# Patient Record
Sex: Female | Born: 1950 | Race: Black or African American | Hispanic: No | Smoking: Never smoker
Health system: Southern US, Community
[De-identification: ages and names within clinical notes are randomized; demographics above are authoritative.]

## PROBLEM LIST (undated history)

## (undated) DIAGNOSIS — E119 Type 2 diabetes mellitus without complications: Secondary | ICD-10-CM

## (undated) DIAGNOSIS — M1990 Unspecified osteoarthritis, unspecified site: Secondary | ICD-10-CM

## (undated) HISTORY — PX: WRIST SURGERY: SHX841

## (undated) HISTORY — DX: Type 2 diabetes mellitus without complications: E11.9

## (undated) HISTORY — DX: Unspecified osteoarthritis, unspecified site: M19.90

---

## 2010-02-13 ENCOUNTER — Ambulatory Visit: Payer: Self-pay | Admitting: Obstetrics & Gynecology

## 2010-02-15 ENCOUNTER — Ambulatory Visit (HOSPITAL_COMMUNITY): Admission: RE | Admit: 2010-02-15 | Discharge: 2010-02-15 | Payer: Self-pay | Admitting: Family Medicine

## 2010-02-28 ENCOUNTER — Ambulatory Visit: Payer: Self-pay | Admitting: Obstetrics and Gynecology

## 2011-06-03 HISTORY — PX: TOTAL HIP ARTHROPLASTY: SHX124

## 2012-11-06 DIAGNOSIS — N83209 Unspecified ovarian cyst, unspecified side: Secondary | ICD-10-CM | POA: Insufficient documentation

## 2012-11-06 DIAGNOSIS — E559 Vitamin D deficiency, unspecified: Secondary | ICD-10-CM | POA: Insufficient documentation

## 2012-11-06 DIAGNOSIS — I10 Essential (primary) hypertension: Secondary | ICD-10-CM | POA: Insufficient documentation

## 2012-11-06 DIAGNOSIS — H409 Unspecified glaucoma: Secondary | ICD-10-CM | POA: Insufficient documentation

## 2012-11-06 DIAGNOSIS — D649 Anemia, unspecified: Secondary | ICD-10-CM | POA: Insufficient documentation

## 2012-11-06 DIAGNOSIS — M1610 Unilateral primary osteoarthritis, unspecified hip: Secondary | ICD-10-CM | POA: Insufficient documentation

## 2012-11-06 DIAGNOSIS — IMO0001 Reserved for inherently not codable concepts without codable children: Secondary | ICD-10-CM | POA: Insufficient documentation

## 2015-03-20 DIAGNOSIS — M5412 Radiculopathy, cervical region: Secondary | ICD-10-CM | POA: Insufficient documentation

## 2015-10-12 DIAGNOSIS — Z83511 Family history of glaucoma: Secondary | ICD-10-CM | POA: Insufficient documentation

## 2015-10-12 DIAGNOSIS — H52223 Regular astigmatism, bilateral: Secondary | ICD-10-CM | POA: Insufficient documentation

## 2015-10-12 DIAGNOSIS — H5210 Myopia, unspecified eye: Secondary | ICD-10-CM | POA: Insufficient documentation

## 2015-10-12 DIAGNOSIS — H401190 Primary open-angle glaucoma, unspecified eye, stage unspecified: Secondary | ICD-10-CM | POA: Insufficient documentation

## 2016-07-23 DIAGNOSIS — H04123 Dry eye syndrome of bilateral lacrimal glands: Secondary | ICD-10-CM | POA: Insufficient documentation

## 2016-07-23 DIAGNOSIS — F40240 Claustrophobia: Secondary | ICD-10-CM | POA: Insufficient documentation

## 2016-09-04 DIAGNOSIS — B351 Tinea unguium: Secondary | ICD-10-CM | POA: Insufficient documentation

## 2016-09-04 DIAGNOSIS — M2040 Other hammer toe(s) (acquired), unspecified foot: Secondary | ICD-10-CM | POA: Insufficient documentation

## 2016-09-04 DIAGNOSIS — R202 Paresthesia of skin: Secondary | ICD-10-CM | POA: Insufficient documentation

## 2016-09-04 DIAGNOSIS — M2011 Hallux valgus (acquired), right foot: Secondary | ICD-10-CM | POA: Insufficient documentation

## 2017-08-05 LAB — HM DIABETES EYE EXAM

## 2017-12-10 LAB — HM DIABETES EYE EXAM

## 2018-06-10 LAB — HM DIABETES EYE EXAM

## 2018-06-30 DIAGNOSIS — M19011 Primary osteoarthritis, right shoulder: Secondary | ICD-10-CM | POA: Insufficient documentation

## 2018-06-30 DIAGNOSIS — M19019 Primary osteoarthritis, unspecified shoulder: Secondary | ICD-10-CM | POA: Insufficient documentation

## 2018-11-01 HISTORY — PX: FOOT SURGERY: SHX648

## 2018-12-10 LAB — HM DIABETES EYE EXAM

## 2019-02-11 LAB — HM DIABETES EYE EXAM

## 2019-04-25 ENCOUNTER — Encounter: Payer: Self-pay | Admitting: Nurse Practitioner

## 2019-04-25 ENCOUNTER — Ambulatory Visit (INDEPENDENT_AMBULATORY_CARE_PROVIDER_SITE_OTHER): Payer: BC Managed Care – PPO | Admitting: Nurse Practitioner

## 2019-04-25 ENCOUNTER — Other Ambulatory Visit: Payer: Self-pay

## 2019-04-25 VITALS — BP 142/70 | HR 76 | Temp 97.2°F | Ht 70.0 in | Wt 196.6 lb

## 2019-04-25 DIAGNOSIS — Z86010 Personal history of colonic polyps: Secondary | ICD-10-CM | POA: Insufficient documentation

## 2019-04-25 DIAGNOSIS — E7800 Pure hypercholesterolemia, unspecified: Secondary | ICD-10-CM

## 2019-04-25 DIAGNOSIS — I10 Essential (primary) hypertension: Secondary | ICD-10-CM

## 2019-04-25 DIAGNOSIS — E1142 Type 2 diabetes mellitus with diabetic polyneuropathy: Secondary | ICD-10-CM | POA: Diagnosis not present

## 2019-04-25 DIAGNOSIS — E559 Vitamin D deficiency, unspecified: Secondary | ICD-10-CM | POA: Diagnosis not present

## 2019-04-25 NOTE — Patient Instructions (Addendum)
Hgb A1c of 8.6. need to increase metformin to 500mg  BID. Need to discontinue metformin ER due to recent medication recall. New rx sent. Normal thyroid, renal and liver function. Inquire if she want to add lyrica at bedtime for leg pain and referral to PT? Start vitamin D supplement 1000IU BID OTC

## 2019-04-25 NOTE — Progress Notes (Signed)
Subjective:  Patient ID: Veronica Alvarado, female    DOB: 10/22/1950  Age: 68 y.o. MRN: [id number]  CC: Establish Care (est care--legs and shoulder painful--tingling worse at night--meds consult?)  Ms. Joyc[facility]immerman [facility] Dr. Jovita KussmaulYbannez. She was last seen in office 03/08/2019. She has hx of DM, HTN, Hyperlipidemia, neuropathy, leukopenia, chronic LE edema and vitamin D deficiency. Today she want to address right arm pain and bilateral leg pain.  Arm Pain  The incident occurred more than 1 week ago. There was no injury mechanism. The pain is present in the right forearm. The quality of the pain is described as aching and shooting. Radiates to: right wrist. The pain has been intermittent since the incident. Associated symptoms include tingling. Pertinent negatives include no chest pain, muscle weakness or numbness. The symptoms are aggravated by movement. She has tried nothing for the symptoms.  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 Bilateral leg pain is chronic and intermittent, she describes as tightness and burning sensation, localized over hamstrings, worsen at night. She has hx of s/p bilateral hip arthroplasty. She denies any falls in last 1year. She completed PT post surgery and this was helpful. Current use of gabapentin 300mg  TID. hx of statin intolerance (elevated CK per previous pcp)-simvastatin discontinued. Last vit D pf 19: no supplement at this time  Medicare Annual Wellness Visit 01/29/2019 01/28/2018, 12/18/2016 (Done)  MAMMOGRAM 02/16/2019 02/15/2018, 11/17/2016  INFLUENZA VACCINE 08/31/2019 (Originally 01/01/2019) 01/28/2018, 02/20/2017  FOOT EXAM 06/29/2019 06/28/2018 (N/S), 01/28/2018 (Done)  OPHTHALMOLOGY EXAM 12/10/2019 12/10/2018, 12/10/2018 (Done)  COLONOSCOPY 07/17/2021 07/17/2016, 07/07/2016 (Done)   Immunization History  Administered Date(s) Administered   Flu Vaccine 30mo and up (FLUZONE/FLULAVAL/FLUARIX VIAL OR SYRINGE) INACTIVATED QUADRAVALENT 02/20/2017,  01/28/2018   Tdap (ADACEL, BOOSTRIX) 10/22/2010   influenza (Historical) 03/23/2009, 03/26/2010   pneumococcal conjugate 13-valent (PREVNAR) 03/10/2016   pneumococcal polysaccharide 23-valent (PNEUMOVAX 23) 10/10/2004, 10/19/2014, 05/08/2017   tuberculin PPD Test (APLISOL, TUBERSOL) 03/10/2016   Reviewed past Medical, Social and Family history today.  Outpatient Medications Prior to Visit  Medication Sig Dispense Refill   aspirin EC 81 MG tablet Take by mouth.     dorzolamide (TRUSOPT) 2 % ophthalmic solution INSTILL 1 DROP INTO BOTH EYES TWICE A DAY     furosemide (LASIX) 20 MG tablet TAKE 1 DAILY AS NEEDED FOR SWELLING IN FEET.     gabapentin (NEURONTIN) 300 MG capsule TAKE 1 CAPSULE BY MOUTH THREE TIMES A DAY     HYDROcodone-acetaminophen (NORCO/VICODIN) 5-325 MG tablet Take by mouth.     Multiple Vitamin (MULTI-VITAMIN) tablet Take by mouth.     Netarsudil-Latanoprost 0.02-0.005 % SOLN Place 1 drop into both eyes nightly.     metFORMIN (GLUCOPHAGE-XR) 500 MG 24 hr tablet TAKE 1 TABLET BY MOUTH TWICE A DAY WITH MEALS     No facility-administered medications prior to visit.     ROS See HPI  Objective:  BP (!) 142/70    Pulse 76    Temp (!) 97.2 F (36.2 C) (Tympanic)    Ht 5\' 10"  (1.778 m)    Wt 196 lb 9.6 oz (89.2 kg)    SpO2 98%    BMI 28.21 kg/m   BP Readings from Last 3 Encounters:  04/25/19 (!) 142/70    Wt Readings from Last 3 Encounters:  04/25/19 196 lb 9.6 oz (89.2 kg)    Physical Exam Vitals signs reviewed.  Cardiovascular:     Rate and Rhythm: Normal rate and regular rhythm.  Pulses: Normal pulses.     Heart sounds: Normal heart sounds.  Pulmonary:     Effort: Pulmonary effort is normal.     Breath sounds: Normal breath sounds.  Musculoskeletal:     Right hip: Normal.     Left hip: Normal.     Right upper leg: Normal.     Left upper leg: Normal.     Right lower leg: Edema present.     Left lower leg: Edema present.  Neurological:      Mental Status: She is alert and oriented to person, place, and time.     Coordination: Coordination normal.     Gait: Gait normal.     Deep Tendon Reflexes: Reflexes normal.  Psychiatric:        Mood and Affect: Mood normal.        Behavior: Behavior normal.        Thought Content: Thought content normal.     Lab Results  Component Value Date   WBC 4.1 04/25/2019   HGB 11.7 04/25/2019   HCT 35.6 04/25/2019   PLT 229 04/25/2019   GLUCOSE 104 (H) 04/25/2019   ALT 18 04/25/2019   AST 27 04/25/2019   NA 141 04/25/2019   K 4.3 04/25/2019   CL 104 04/25/2019   CREATININE 0.84 04/25/2019   BUN 14 04/25/2019   CO2 25 04/25/2019   TSH 1.19 04/25/2019   HGBA1C 8.6 (H) 04/25/2019   MICROALBUR 1.1 04/25/2019    Assessment & Plan:   Adalind was seen today for establish care.  Diagnoses and all orders for this visit:  Essential hypertension -     CBC -     TSH  Diabetic polyneuropathy associated with type 2 diabetes mellitus (HCC) -     metFORMIN (GLUCOPHAGE) 500 MG tablet; Take 1 tablet (500 mg total) by mouth 2 (two) times daily with a meal.  Controlled type 2 diabetes mellitus with diabetic polyneuropathy, without long-term current use of insulin (HCC) -     Microalbumin / creatinine urine ratio -     Hemoglobin A1c -     HTN_1 BMP -     metFORMIN (GLUCOPHAGE) 500 MG tablet; Take 1 tablet (500 mg total) by mouth 2 (two) times daily with a meal.  Hypercholesterolemia -     Hepatic function panel  Vitamin D deficiency -     cholecalciferol (VITAMIN D3) 25 MCG (1000 UT) tablet; Take 1 tablet (1,000 Units total) by mouth 2 (two) times daily.   I have discontinued Kabria Hallinan's metFORMIN. I am also having her start on metFORMIN and cholecalciferol. Additionally, I am having her maintain her aspirin EC, dorzolamide, furosemide, gabapentin, HYDROcodone-acetaminophen, Multi-Vitamin, and Netarsudil-Latanoprost.  Meds ordered this encounter  Medications   metFORMIN  (GLUCOPHAGE) 500 MG tablet    Sig: Take 1 tablet (500 mg total) by mouth 2 (two) times daily with a meal.    Dispense:  180 tablet    Refill:  3    Discontinue metformin ER    Order Specific Question:   Supervising Provider    Answer:   Lucille Passy [[id number]]   cholecalciferol (VITAMIN D3) 25 MCG (1000 UT) tablet    Sig: Take 1 tablet (1,000 Units total) by mouth 2 (two) times daily.    Order Specific Question:   Supervising Provider    Answer:   Lucille Passy [[id number]]    Problem List Items Addressed This Visit      Cardiovascular and Mediastinum  Essential hypertension - Primary   Relevant Medications   aspirin EC 81 MG tablet   furosemide (LASIX) 20 MG tablet   Other Relevant Orders   CBC (Completed)   TSH (Completed)     Endocrine   Controlled type 2 diabetes mellitus, without long-term current use of insulin (HCC)   Relevant Medications   aspirin EC 81 MG tablet   metFORMIN (GLUCOPHAGE) 500 MG tablet   Other Relevant Orders   Microalbumin / creatinine urine ratio (Completed)   Hemoglobin A1c (Completed)   HTN_1 BMP (Completed)   Polyneuropathy in diabetes (HCC)   Relevant Medications   aspirin EC 81 MG tablet   gabapentin (NEURONTIN) 300 MG capsule   metFORMIN (GLUCOPHAGE) 500 MG tablet     Other   Hypercholesterolemia   Relevant Medications   aspirin EC 81 MG tablet   furosemide (LASIX) 20 MG tablet   Other Relevant Orders   Hepatic function panel (Completed)    Other Visit Diagnoses    Vitamin D deficiency       Relevant Medications   cholecalciferol (VITAMIN D3) 25 MCG (1000 UT) tablet       Follow-up: Return in about 3 months (around 07/26/2019) for DM and HTN, hyperlipidemia (video or F2F).  Alysia Penna, NP

## 2019-04-26 LAB — HEPATIC FUNCTION PANEL
ALT: 18 U/L (ref 0–35)
AST: 27 U/L (ref 0–37)
Albumin: 3.9 g/dL (ref 3.5–5.2)
Alkaline Phosphatase: 46 U/L (ref 39–117)
Bilirubin, Direct: 0.1 mg/dL (ref 0.0–0.3)
Total Bilirubin: 0.4 mg/dL (ref 0.2–1.2)
Total Protein: 7.5 g/dL (ref 6.0–8.3)

## 2019-04-26 LAB — CBC
HCT: 35.6 % (ref 35.0–45.0)
Hemoglobin: 11.7 g/dL (ref 11.7–15.5)
MCH: 27 pg (ref 27.0–33.0)
MCHC: 32.9 g/dL (ref 32.0–36.0)
MCV: 82.2 fL (ref 80.0–100.0)
MPV: 11.8 fL (ref 7.5–12.5)
Platelets: 229 10*3/uL (ref 140–400)
RBC: 4.33 10*6/uL (ref 3.80–5.10)
RDW: 13.1 % (ref 11.0–15.0)
WBC: 4.1 10*3/uL (ref 3.8–10.8)

## 2019-04-26 LAB — MICROALBUMIN / CREATININE URINE RATIO
Creatinine, Urine: 346 mg/dL — ABNORMAL HIGH (ref 20–275)
Microalb Creat Ratio: 3 mcg/mg creat (ref <30)
Microalb, Ur: 1.1 mg/dL

## 2019-04-26 LAB — BASIC METABOLIC PANEL
BUN: 14 mg/dL (ref 7–25)
CO2: 25 mmol/L (ref 20–32)
Calcium: 9.5 mg/dL (ref 8.6–10.4)
Chloride: 104 mmol/L (ref 98–110)
Creat: 0.84 mg/dL (ref 0.50–0.99)
Glucose, Bld: 104 mg/dL — ABNORMAL HIGH (ref 65–99)
Potassium: 4.3 mmol/L (ref 3.5–5.3)
Sodium: 141 mmol/L (ref 135–146)

## 2019-04-26 LAB — HEMOGLOBIN A1C: Hgb A1c MFr Bld: 8.6 % — ABNORMAL HIGH (ref 4.6–6.5)

## 2019-04-26 LAB — TSH: TSH: 1.19 mIU/L (ref 0.40–4.50)

## 2019-04-27 ENCOUNTER — Encounter: Payer: Self-pay | Admitting: Nurse Practitioner

## 2019-04-27 MED ORDER — VITAMIN D 25 MCG (1000 UNIT) PO TABS: 1000.0000 [IU] | ORAL_TABLET | Freq: Two times a day (BID) | ORAL | Status: DC

## 2019-04-27 MED ORDER — METFORMIN HCL 500 MG PO TABS
500.0000 mg | ORAL_TABLET | Freq: Two times a day (BID) | ORAL | 3 refills | Status: DC
Start: 2019-04-27 — End: 2019-10-05

## 2019-05-02 ENCOUNTER — Telehealth: Payer: Self-pay | Admitting: Nurse Practitioner

## 2019-05-02 DIAGNOSIS — M79605 Pain in left leg: Secondary | ICD-10-CM

## 2019-05-02 DIAGNOSIS — E1142 Type 2 diabetes mellitus with diabetic polyneuropathy: Secondary | ICD-10-CM

## 2019-05-02 DIAGNOSIS — M79604 Pain in right leg: Secondary | ICD-10-CM

## 2019-05-02 DIAGNOSIS — Z780 Asymptomatic menopausal state: Secondary | ICD-10-CM

## 2019-05-02 DIAGNOSIS — R200 Anesthesia of skin: Secondary | ICD-10-CM

## 2019-05-02 MED ORDER — GABAPENTIN 300 MG PO CAPS: ORAL_CAPSULE | ORAL | 5 refills | Status: DC

## 2019-05-02 NOTE — Telephone Encounter (Signed)
-----   Message from Akilah T Johnson, CMA sent at 04/27/2019  4:32 PM EST ----- Patient is aware of results and verbalized understanding. Pt would like lyrica as well for leg pain. She haven't had a bone density test in years. Patient will pick up new metformin rx tomorrow if pharmacy is open and vit D supp. 

## 2019-05-02 NOTE — Telephone Encounter (Signed)
LVM for the pt to call back--need to inform about gabapentin and bone density (order in system--pt will get a call from the admin team to schedule bone density).

## 2019-05-02 NOTE — Telephone Encounter (Signed)
Instead of adding lyrica at bedtime, advise to increase gabapentin dose to 2caps at bedtime, 1 cap in AM and at [facility]. I entered order for bone density scan and ref for PT

## 2019-05-02 NOTE — Telephone Encounter (Signed)
-----   Message from Veronica Alvarado, [place] sent at 04/27/2019  4:32 PM EST ----- Patient is aware of results and verbalized understanding. Pt would like lyrica as well for leg pain. She haven't had a bone density test in years. Patient will pick up new metformin rx tomorrow if pharmacy is open and vit D supp.

## 2019-05-02 NOTE — Telephone Encounter (Signed)
Informed patient of PCP's recommendations below. She verbalized understanding and did not have any further questions or concerns prior to call ending.

## 2019-05-04 NOTE — Telephone Encounter (Signed)
Pt called and stated that she work with the PT before twice and it never help her. Pt does not want to schedule with PT again.   FYI

## 2019-06-16 LAB — HM DIABETES EYE EXAM

## 2019-06-27 ENCOUNTER — Other Ambulatory Visit: Payer: Self-pay | Admitting: Nurse Practitioner

## 2019-07-08 ENCOUNTER — Telehealth: Payer: Self-pay | Admitting: Nurse Practitioner

## 2019-07-08 NOTE — Telephone Encounter (Signed)
Patient is calling requesting that her bone density be scheduled in [place] closer to her home. CB is [phone]

## 2019-07-08 NOTE — Telephone Encounter (Signed)
Admin team please help  Are we able to change the appt date for bone density with the [facility] location that we request. Pt schedule to see GI on 07/27/2019.

## 2019-07-17 ENCOUNTER — Ambulatory Visit: Payer: BC Managed Care – PPO

## 2019-07-18 NOTE — Telephone Encounter (Signed)
Appointment has been changed to [facility] location as patient requested. Patient aware of appointment.

## 2019-07-20 ENCOUNTER — Other Ambulatory Visit: Payer: Self-pay | Admitting: Nurse Practitioner

## 2019-07-25 ENCOUNTER — Ambulatory Visit (HOSPITAL_BASED_OUTPATIENT_CLINIC_OR_DEPARTMENT_OTHER)
Admission: RE | Admit: 2019-07-25 | Discharge: 2019-07-25 | Disposition: A | Discharge status: Home / Self Care | Payer: BC Managed Care – PPO | Source: Ambulatory Visit | Attending: Nurse Practitioner | Admitting: Nurse Practitioner

## 2019-07-25 ENCOUNTER — Other Ambulatory Visit: Payer: Self-pay

## 2019-07-25 DIAGNOSIS — Z780 Asymptomatic menopausal state: Secondary | ICD-10-CM | POA: Diagnosis not present

## 2019-07-26 ENCOUNTER — Ambulatory Visit: Payer: Medicare Other | Admitting: Nurse Practitioner

## 2019-07-27 ENCOUNTER — Other Ambulatory Visit: Payer: Medicare Other

## 2019-08-08 ENCOUNTER — Telehealth: Payer: Self-pay | Admitting: Nurse Practitioner

## 2019-08-08 NOTE — Telephone Encounter (Signed)
error 

## 2019-08-10 ENCOUNTER — Ambulatory Visit: Payer: Medicare Other | Admitting: Nurse Practitioner

## 2019-08-13 ENCOUNTER — Other Ambulatory Visit: Payer: Self-pay | Admitting: Nurse Practitioner

## 2019-09-04 ENCOUNTER — Encounter: Payer: Self-pay | Admitting: Nurse Practitioner

## 2019-09-06 ENCOUNTER — Other Ambulatory Visit: Payer: Self-pay | Admitting: Nurse Practitioner

## 2019-09-14 LAB — HM DIABETES EYE EXAM

## 2019-09-20 LAB — HM DIABETES EYE EXAM

## 2019-10-04 ENCOUNTER — Other Ambulatory Visit: Payer: Self-pay

## 2019-10-05 ENCOUNTER — Ambulatory Visit (INDEPENDENT_AMBULATORY_CARE_PROVIDER_SITE_OTHER): Payer: BC Managed Care – PPO | Admitting: Nurse Practitioner

## 2019-10-05 ENCOUNTER — Encounter: Payer: Self-pay | Admitting: Nurse Practitioner

## 2019-10-05 VITALS — BP 128/68 | HR 75 | Temp 96.4°F | Ht 70.0 in | Wt 193.8 lb

## 2019-10-05 DIAGNOSIS — E1169 Type 2 diabetes mellitus with other specified complication: Secondary | ICD-10-CM | POA: Diagnosis not present

## 2019-10-05 DIAGNOSIS — I10 Essential (primary) hypertension: Secondary | ICD-10-CM | POA: Diagnosis not present

## 2019-10-05 DIAGNOSIS — E7800 Pure hypercholesterolemia, unspecified: Secondary | ICD-10-CM

## 2019-10-05 DIAGNOSIS — Z01818 Encounter for other preprocedural examination: Secondary | ICD-10-CM

## 2019-10-05 DIAGNOSIS — E1142 Type 2 diabetes mellitus with diabetic polyneuropathy: Secondary | ICD-10-CM

## 2019-10-05 LAB — LIPID PANEL
Cholesterol: 145 mg/dL (ref 0–200)
HDL: 55.7 mg/dL (ref >39.00)
LDL Cholesterol: 68 mg/dL (ref 0–99)
NonHDL: 89.03
Total CHOL/HDL Ratio: 3
Triglycerides: 103 mg/dL (ref 0.0–149.0)
VLDL: 20.6 mg/dL (ref 0.0–40.0)

## 2019-10-05 LAB — BASIC METABOLIC PANEL
BUN: 14 mg/dL (ref 6–23)
CO2: 30 mEq/L (ref 19–32)
Calcium: 9.2 mg/dL (ref 8.4–10.5)
Chloride: 102 mEq/L (ref 96–112)
Creatinine, Ser: 0.9 mg/dL (ref 0.40–1.20)
GFR: 75.01 mL/min (ref >60.00)
Glucose, Bld: 164 mg/dL — ABNORMAL HIGH (ref 70–99)
Potassium: 3.5 mEq/L (ref 3.5–5.1)
Sodium: 137 mEq/L (ref 135–145)

## 2019-10-05 LAB — HEMOGLOBIN A1C: Hgb A1c MFr Bld: 8.4 % — ABNORMAL HIGH (ref 4.6–6.5)

## 2019-10-05 MED ORDER — METFORMIN HCL 500 MG PO TABS: 500.0000 mg | ORAL_TABLET | Freq: Two times a day (BID) | ORAL | 3 refills | Status: DC

## 2019-10-05 MED ORDER — GLIPIZIDE ER 2.5 MG PO TB24: 2.5000 mg | ORAL_TABLET | Freq: Every day | ORAL | 3 refills | Status: DC

## 2019-10-05 NOTE — Assessment & Plan Note (Signed)
BP at goal No medication needed at this time Chronic LE edema with use of furosemide prn BP Readings from Last 3 Encounters:  10/05/19 128/68  04/25/19 (!) 142/70

## 2019-10-05 NOTE — Progress Notes (Signed)
Subjective:  Patient ID: Veronica Alvarado, female    DOB: 1950-07-14  Age: 69 y.o. MRN: [id number]  CC: Follow-up (3 mo f/u-DM, HTN, Hyperlipidemia-pt is fasting-pt is blood sugar around 95, doesn't check BP-Eye exam this year-not sure of place off [address]//not sure on colonoscopy or mammogram)  HPI Veronica Alvarado report upcoming Right foot bunion revision surgery scheduled in June 2021, Veronica Alvarado denies any hx of anesthesia complication.  DM: Elevated HgbA1c at 8.6 Does not check glucose at home. Positive neuropathy and charcoat foot. Negative urine nephropathy. Reports Veronica Alvarado had DM eye exam completed. BP at goal LDL at goal, unable to tolerate satin BP Readings from Last 3 Encounters:  10/05/19 128/68  04/25/19 (!) 142/70   Lipid Panel     Component Value Date/Time   CHOL 145 10/05/2019 0925   TRIG 103.0 10/05/2019 0925   HDL 55.70 10/05/2019 0925   CHOLHDL 3 10/05/2019 0925   VLDL 20.6 10/05/2019 0925   LDLCALC 68 10/05/2019 0925   Reviewed past Medical, Social and Family history today.  Outpatient Medications Prior to Visit  Medication Sig Dispense Refill  . aspirin EC 81 MG tablet Take by mouth.    . cholecalciferol (VITAMIN D3) 25 MCG (1000 UT) tablet Take 1 tablet (1,000 Units total) by mouth 2 (two) times daily.    . dorzolamide (TRUSOPT) 2 % ophthalmic solution INSTILL 1 DROP INTO BOTH EYES TWICE A DAY    . furosemide (LASIX) 20 MG tablet Take 1 tablet (20 mg total) by mouth daily as needed. Need office visit for additional refills 30 tablet 0  . gabapentin (NEURONTIN) 300 MG capsule 1cap in AM and Noon, 2caps at bedtime 120 capsule 5  . HYDROcodone-acetaminophen (NORCO/VICODIN) 5-325 MG tablet Take by mouth.    . Multiple Vitamin (MULTI-VITAMIN) tablet Take by mouth.    . Netarsudil-Latanoprost 0.02-0.005 % SOLN Place 1 drop into both eyes nightly.    . metFORMIN (GLUCOPHAGE) 500 MG tablet Take 1 tablet (500 mg total) by mouth 2 (two) times daily with a meal. 180  tablet 3   No facility-administered medications prior to visit.    ROS See HPI  Objective:  BP 128/68   Pulse 75   Temp (!) 96.4 F (35.8 C) (Tympanic)   Ht 5\' 10"  (1.778 m)   Wt 193 lb 12.8 oz (87.9 kg)   SpO2 100%   BMI 27.81 kg/m   BP Readings from Last 3 Encounters:  10/05/19 128/68  04/25/19 (!) 142/70   Wt Readings from Last 3 Encounters:  10/05/19 193 lb 12.8 oz (87.9 kg)  04/25/19 196 lb 9.6 oz (89.2 kg)   ECG: NSR, no change compared to previous ECG with [facility].  Physical Exam Vitals reviewed.  Constitutional:      Appearance: Veronica Alvarado is obese.  Cardiovascular:     Rate and Rhythm: Normal rate and regular rhythm.     Pulses: Normal pulses.  Pulmonary:     Effort: Pulmonary effort is normal.     Breath sounds: Normal breath sounds.  Abdominal:     General: There is no distension.     Palpations: Abdomen is soft.     Tenderness: There is no abdominal tenderness.  Musculoskeletal:     Cervical back: Normal range of motion and neck supple.     Right lower leg: Edema present.     Left lower leg: Edema present.  Skin:    Comments: Completed diabetic foot exam today  Neurological:     Mental Status:  Veronica Alvarado is alert and oriented to person, place, and time.  Psychiatric:        Mood and Affect: Mood normal.        Behavior: Behavior normal.        Thought Content: Thought content normal.    Lab Results  Component Value Date   WBC 4.1 04/25/2019   HGB 11.7 04/25/2019   HCT 35.6 04/25/2019   PLT 229 04/25/2019   GLUCOSE 164 (H) 10/05/2019   CHOL 145 10/05/2019   TRIG 103.0 10/05/2019   HDL 55.70 10/05/2019   LDLCALC 68 10/05/2019   ALT 18 04/25/2019   AST 27 04/25/2019   NA 137 10/05/2019   K 3.5 10/05/2019   CL 102 10/05/2019   CREATININE 0.90 10/05/2019   BUN 14 10/05/2019   CO2 30 10/05/2019   TSH 1.19 04/25/2019   HGBA1C 8.4 (H) 10/05/2019   MICROALBUR 1.1 04/25/2019    Assessment & Plan:  This visit occurred during the SARS-CoV-2 public  health emergency.  Safety protocols were in place, including screening questions prior to the visit, additional usage of staff PPE, and extensive cleaning of exam room while observing appropriate contact time as indicated for disinfecting solutions.   Veronica Alvarado was seen today for follow-up.  Diagnoses and all orders for this visit:  Essential hypertension -     Basic metabolic panel -     EKG 15-VVOH  Type 2 diabetes mellitus with other specified complication, without long-term current use of insulin (HCC) -     Hemoglobin A1c -     Basic metabolic panel -     glipiZIDE (GLUCOTROL XL) 2.5 MG 24 hr tablet; Take 1 tablet (2.5 mg total) by mouth daily with breakfast. With heaviest meal  Hypercholesterolemia -     Lipid panel  Pre-operative clearance -     EKG 12-Lead  Diabetic polyneuropathy associated with type 2 diabetes mellitus (HCC) -     metFORMIN (GLUCOPHAGE) 500 MG tablet; Take 1 tablet (500 mg total) by mouth 2 (two) times daily with a meal.  Controlled type 2 diabetes mellitus with diabetic polyneuropathy, without long-term current use of insulin (HCC) -     metFORMIN (GLUCOPHAGE) 500 MG tablet; Take 1 tablet (500 mg total) by mouth 2 (two) times daily with a meal.   Veronica Alvarado am having Veronica Alvarado start on glipiZIDE. Veronica Alvarado am also having Veronica Alvarado maintain Veronica Alvarado aspirin EC, dorzolamide, HYDROcodone-acetaminophen, Multi-Vitamin, Netarsudil-Latanoprost, cholecalciferol, gabapentin, furosemide, and metFORMIN.  Meds ordered this encounter  Medications  . glipiZIDE (GLUCOTROL XL) 2.5 MG 24 hr tablet    Sig: Take 1 tablet (2.5 mg total) by mouth daily with breakfast. With heaviest meal    Dispense:  90 tablet    Refill:  3    Order Specific Question:   Supervising Provider    Answer:   Ronnald Nian [[id number]]  . metFORMIN (GLUCOPHAGE) 500 MG tablet    Sig: Take 1 tablet (500 mg total) by mouth 2 (two) times daily with a meal.    Dispense:  180 tablet    Refill:  3    Discontinue  metformin ER    Order Specific Question:   Supervising Provider    Answer:   Ronnald Nian [[id number]]    Problem List Items Addressed This Visit      Cardiovascular and Mediastinum   Essential hypertension - Primary   Relevant Orders   Basic metabolic panel (Completed)   EKG 12-Lead (Completed)     Endocrine  Controlled type 2 diabetes mellitus, without long-term current use of insulin (HCC)   Relevant Medications   glipiZIDE (GLUCOTROL XL) 2.5 MG 24 hr tablet   metFORMIN (GLUCOPHAGE) 500 MG tablet   Polyneuropathy in diabetes (HCC)   Relevant Medications   glipiZIDE (GLUCOTROL XL) 2.5 MG 24 hr tablet   metFORMIN (GLUCOPHAGE) 500 MG tablet     Other   Hypercholesterolemia   Relevant Orders   Lipid panel (Completed)    Other Visit Diagnoses    Pre-operative clearance       Relevant Orders   EKG 12-Lead (Completed)      Follow-up: Return in about 6 months (around 04/06/2020) for DM and HTN, hyperlipidemia.  Veronica Penna, NP

## 2019-10-05 NOTE — Assessment & Plan Note (Signed)
Elevated HgbA1c at 8.6 Does not check glucose at home. Positive neuropathy and charcoat foot. Negative urine nephropathy. Reports she had DM eye exam completed. BP at goal LDL at goal, unable to tolerate satin  BP Readings from Last 3 Encounters:  10/05/19 128/68  04/25/19 (!) 142/70   Lipid Panel     Component Value Date/Time   CHOL 145 10/05/2019 0925   TRIG 103.0 10/05/2019 0925   HDL 55.70 10/05/2019 0925   CHOLHDL 3 10/05/2019 0925   VLDL 20.6 10/05/2019 0925   LDLCALC 68 10/05/2019 0925   Maintain metformin Add glipizide 2.5mg  daily Get eye exam report. Advised about proper foot care and daily check Advised to start gulcose check 2-3x/week (fasting) F/up in 3months

## 2019-10-05 NOTE — Patient Instructions (Addendum)
No change in ECG compared to previous done by [facility].  Have latest eye exam report faxed to me.  Go to lab for blood draw.  Check glucose 2-3x/week before breakfast.  Stable lipid panel with LDL at goal. Stable renal function HgbA1c at 8.4. continue metformin at current dose. Add glipizide, new rx sent. Need to start glucose check before breakfast 3x/week. Bring readings to next OV F/up in 66months

## 2019-10-15 ENCOUNTER — Other Ambulatory Visit: Payer: Self-pay | Admitting: Nurse Practitioner

## 2019-12-27 ENCOUNTER — Other Ambulatory Visit: Payer: Self-pay | Admitting: Nurse Practitioner

## 2019-12-27 DIAGNOSIS — R200 Anesthesia of skin: Secondary | ICD-10-CM

## 2019-12-27 DIAGNOSIS — E1142 Type 2 diabetes mellitus with diabetic polyneuropathy: Secondary | ICD-10-CM

## 2020-01-01 ENCOUNTER — Other Ambulatory Visit: Payer: Self-pay | Admitting: Nurse Practitioner

## 2020-01-01 DIAGNOSIS — E1169 Type 2 diabetes mellitus with other specified complication: Secondary | ICD-10-CM

## 2020-01-02 NOTE — Telephone Encounter (Signed)
Last OV 10/05/19 Last fill 10/05/19  #90/3

## 2020-01-13 ENCOUNTER — Other Ambulatory Visit: Payer: Self-pay | Admitting: Nurse Practitioner

## 2020-02-27 ENCOUNTER — Other Ambulatory Visit: Payer: Self-pay | Admitting: Nurse Practitioner

## 2020-03-26 LAB — HM MAMMOGRAPHY

## 2020-04-09 ENCOUNTER — Encounter: Payer: Self-pay | Admitting: Nurse Practitioner

## 2020-04-09 ENCOUNTER — Ambulatory Visit (INDEPENDENT_AMBULATORY_CARE_PROVIDER_SITE_OTHER): Payer: BC Managed Care – PPO | Admitting: Nurse Practitioner

## 2020-04-09 ENCOUNTER — Other Ambulatory Visit: Payer: Self-pay

## 2020-04-09 VITALS — BP 138/82 | HR 80 | Temp 96.6°F | Ht 70.0 in | Wt 196.6 lb

## 2020-04-09 DIAGNOSIS — M170 Bilateral primary osteoarthritis of knee: Secondary | ICD-10-CM

## 2020-04-09 DIAGNOSIS — I10 Essential (primary) hypertension: Secondary | ICD-10-CM

## 2020-04-09 DIAGNOSIS — E1169 Type 2 diabetes mellitus with other specified complication: Secondary | ICD-10-CM

## 2020-04-09 DIAGNOSIS — E7800 Pure hypercholesterolemia, unspecified: Secondary | ICD-10-CM

## 2020-04-09 DIAGNOSIS — E559 Vitamin D deficiency, unspecified: Secondary | ICD-10-CM | POA: Diagnosis not present

## 2020-04-09 LAB — BASIC METABOLIC PANEL WITH GFR
BUN: 17 mg/dL (ref 6–23)
CO2: 28 meq/L (ref 19–32)
Calcium: 9.7 mg/dL (ref 8.4–10.5)
Chloride: 103 meq/L (ref 96–112)
Creatinine, Ser: 1 mg/dL (ref 0.40–1.20)
GFR: 57.28 mL/min — ABNORMAL LOW
Glucose, Bld: 113 mg/dL — ABNORMAL HIGH (ref 70–99)
Potassium: 4.1 meq/L (ref 3.5–5.1)
Sodium: 139 meq/L (ref 135–145)

## 2020-04-09 LAB — HEMOGLOBIN A1C: Hgb A1c MFr Bld: 7.1 % — ABNORMAL HIGH (ref 4.6–6.5)

## 2020-04-09 LAB — MICROALBUMIN / CREATININE URINE RATIO
Creatinine,U: 16.3 mg/dL
Microalb Creat Ratio: 4.3 mg/g (ref 0.0–30.0)
Microalb, Ur: <0.7 mg/dL (ref 0.0–1.9)

## 2020-04-09 MED ORDER — MELOXICAM 7.5 MG PO TABS: 7.5000 mg | ORAL_TABLET | Freq: Every day | ORAL | 1 refills | Status: DC

## 2020-04-09 MED ORDER — BLOOD GLUCOSE MONITOR KIT: PACK | 0 refills | Status: AC

## 2020-04-09 NOTE — Progress Notes (Signed)
Subjective:  Patient ID: Veronica Alvarado, female    DOB: 1951-02-06  Age: 69 y.o. MRN: [id number]  CC: Follow-up (6 month f/u on DM, HTN, and cholesteral. Pt states she has been experiencing pain in her left knee x2 weeks. )  Knee Pain  The incident occurred more than 1 week ago. There was no injury mechanism. The pain is present in the left knee and right knee. The quality of the pain is described as aching. The pain is moderate. The pain has been fluctuating since onset. Pertinent negatives include no inability to bear weight, loss of motion, loss of sensation, muscle weakness, numbness or tingling. The symptoms are aggravated by movement and weight bearing. She has tried ice and rest for the symptoms. The treatment provided moderate relief.    Essential hypertension BP at goal Persistent LE edema, stable with use of furosemide prn BP Readings from Last 3 Encounters:  04/09/20 138/82  10/05/19 128/68  04/25/19 (!) 142/70   Repeat BMP: mild decline Hold furosemide Repeat BMP in 70month DM (diabetes mellitus) (HCC) No adverse side effects with glipizide and metformin Last HgbA1c of 8.4 provided glucometer rx today: check glucose every AM before breakfast  Repeat HgbA1c: improved 8.4 to 7.1 Maintain medication  Hypercholesterolemia Unable to tolerate statin (myalgia and elevated CK) Repeat lipid panel today.  Lipid Panel     Component Value Date/Time   CHOL 145 10/05/2019 0925   TRIG 103.0 10/05/2019 0925   HDL 55.70 10/05/2019 0925   CHOLHDL 3 10/05/2019 0925   VLDL 20.6 10/05/2019 0925   LDLCALC 68 10/05/2019 0925   Wt Readings from Last 3 Encounters:  04/09/20 196 lb 9.6 oz (89.2 kg)  10/05/19 193 lb 12.8 oz (87.9 kg)  04/25/19 196 lb 9.6 oz (89.2 kg)   BP Readings from Last 3 Encounters:  04/09/20 138/82  10/05/19 128/68  04/25/19 (!) 142/70   Reviewed past Medical, Social and Family history today.  Outpatient Medications Prior to Visit  Medication Sig  Dispense Refill  . aspirin EC 81 MG tablet Take by mouth.    . cholecalciferol (VITAMIN D3) 25 MCG (1000 UT) tablet Take 1 tablet (1,000 Units total) by mouth 2 (two) times daily.    . dorzolamide (TRUSOPT) 2 % ophthalmic solution INSTILL 1 DROP INTO BOTH EYES TWICE A DAY    . furosemide (LASIX) 20 MG tablet TAKE 1 TABLET BY MOUTH EVERY DAY AS NEEDED 90 tablet 0  . gabapentin (NEURONTIN) 300 MG capsule TAKE 1 CAPSULE BY MOUTH IN AM AND NOON, AND 2 CAPS AT BEDTIME 120 capsule 5  . glipiZIDE (GLUCOTROL XL) 2.5 MG 24 hr tablet TAKE 1 TABLET (2.5 MG TOTAL) BY MOUTH DAILY WITH BREAKFAST. WITH HEAVIEST MEAL 270 tablet 1  . HYDROcodone-acetaminophen (NORCO/VICODIN) 5-325 MG tablet Take by mouth.    . metFORMIN (GLUCOPHAGE) 500 MG tablet Take 1 tablet (500 mg total) by mouth 2 (two) times daily with a meal. 180 tablet 3  . Multiple Vitamin (MULTI-VITAMIN) tablet Take by mouth.    . Netarsudil-Latanoprost 0.02-0.005 % SOLN Place 1 drop into both eyes nightly.     No facility-administered medications prior to visit.    ROS See HPI  Objective:  BP 138/82 (BP Location: Left Arm, Patient Position: Sitting, Cuff Size: Normal)   Pulse 80   Temp (!) 96.6 F (35.9 C) (Temporal)   Ht 5' 10"  (13.295m)   Wt 196 lb 9.6 oz (89.2 kg)   BMI 28.21 kg/m  Physical Exam Constitutional:      Appearance: She is obese.  Cardiovascular:     Rate and Rhythm: Normal rate and regular rhythm.     Pulses: Normal pulses.     Heart sounds: Normal heart sounds.  Pulmonary:     Effort: Pulmonary effort is normal.     Breath sounds: Normal breath sounds.  Musculoskeletal:        General: Tenderness present.     Right knee: Crepitus present. No effusion or erythema. Tenderness present over the medial joint line and lateral joint line. No patellar tendon tenderness.     Left knee: Crepitus present. No effusion or erythema. Tenderness present over the medial joint line and lateral joint line. No patellar tendon  tenderness.     Right lower leg: Normal.     Left lower leg: Normal.     Right ankle: Swelling present. No tenderness.     Left ankle: Swelling present. No tenderness.  Skin:    Findings: No erythema or rash.  Neurological:     Mental Status: She is alert and oriented to person, place, and time.    Assessment & Plan:  This visit occurred during the SARS-CoV-2 public health emergency.  Safety protocols were in place, including screening questions prior to the visit, additional usage of staff PPE, and extensive cleaning of exam room while observing appropriate contact time as indicated for disinfecting solutions.   Veronica Alvarado was seen today for follow-up.  Diagnoses and all orders for this visit:  Essential hypertension -     Basic metabolic panel -     Basic metabolic panel; Future  Type 2 diabetes mellitus with other specified complication, without long-term current use of insulin (HCC) -     Microalbumin / creatinine urine ratio -     Hemoglobin A1c -     Basic metabolic panel -     blood glucose meter kit and supplies KIT; Dispense based on patient and insurance preference. Check glucose once a day before breakfast. ICD-10: E11.65  Hypercholesterolemia  Vitamin D deficiency -     Vitamin D 1,25 dihydroxy  Primary osteoarthritis of both knees -     meloxicam (MOBIC) 7.5 MG tablet; Take 1 tablet (7.5 mg total) by mouth daily. With food -     Basic metabolic panel; Future   Problem List Items Addressed This Visit      Cardiovascular and Mediastinum   Essential hypertension - Primary    BP at goal Persistent LE edema, stable with use of furosemide prn BP Readings from Last 3 Encounters:  04/09/20 138/82  10/05/19 128/68  04/25/19 (!) 142/70   Repeat BMP: mild decline Hold furosemide Repeat BMP in 91month     Relevant Orders   Basic metabolic panel (Completed)   Basic metabolic panel     Endocrine   DM (diabetes mellitus) ([facility]    No adverse side effects with  glipizide and metformin Last HgbA1c of 8.4 provided glucometer rx today: check glucose every AM before breakfast  Repeat HgbA1c: improved 8.4 to 7.1 Maintain medication      Relevant Medications   blood glucose meter kit and supplies KIT   Other Relevant Orders   Microalbumin / creatinine urine ratio (Completed)   Hemoglobin A1c (Completed)   Basic metabolic panel (Completed)     Other   Hypercholesterolemia    Unable to tolerate statin (myalgia and elevated CK) Repeat lipid panel today.  Lipid Panel     Component Value  Date/Time   CHOL 145 10/05/2019 0925   TRIG 103.0 10/05/2019 0925   HDL 55.70 10/05/2019 0925   CHOLHDL 3 10/05/2019 0925   VLDL 20.6 10/05/2019 0925   LDLCALC 68 10/05/2019 0925        Vitamin D deficiency   Relevant Orders   Vitamin D 1,25 dihydroxy    Other Visit Diagnoses    Primary osteoarthritis of both knees       Relevant Medications   meloxicam (MOBIC) 7.5 MG tablet   Other Relevant Orders   Basic metabolic panel      Follow-up: Return in about 6 months (around 10/07/2020) for DM and HTN, hyperlipidemia ([place]).  Wilfred Lacy, NP

## 2020-04-09 NOTE — Assessment & Plan Note (Addendum)
BP at goal Persistent LE edema, stable with use of furosemide prn BP Readings from Last 3 Encounters:  04/09/20 138/82  10/05/19 128/68  04/25/19 (!) 142/70   Repeat BMP: mild decline Hold furosemide Repeat BMP in 71month

## 2020-04-09 NOTE — Patient Instructions (Addendum)
Go to lab for blood draw and urine collection.  Use meloxicam for knee pain. May also use topical Biofreeze gel or Aspercreme OTC as needed.  Schedule appt for repeat mammogram: [phone]

## 2020-04-09 NOTE — Assessment & Plan Note (Signed)
Unable to tolerate statin (myalgia and elevated CK) Repeat lipid panel today.  Lipid Panel     Component Value Date/Time   CHOL 145 10/05/2019 0925   TRIG 103.0 10/05/2019 0925   HDL 55.70 10/05/2019 0925   CHOLHDL 3 10/05/2019 0925   VLDL 20.6 10/05/2019 0925   LDLCALC 68 10/05/2019 0925

## 2020-04-09 NOTE — Assessment & Plan Note (Addendum)
No adverse side effects with glipizide and metformin Last HgbA1c of 8.4 provided glucometer rx today: check glucose every AM before breakfast  Repeat HgbA1c: improved 8.4 to 7.1 Maintain medication

## 2020-04-13 LAB — VITAMIN D 1,25 DIHYDROXY
Vitamin D 1, 25 (OH)2 Total: 53 pg/mL (ref 18–72)
Vitamin D2 1, 25 (OH)2: <8 pg/mL
Vitamin D3 1, 25 (OH)2: 53 pg/mL

## 2020-05-09 ENCOUNTER — Other Ambulatory Visit: Payer: Self-pay

## 2020-05-10 ENCOUNTER — Ambulatory Visit (INDEPENDENT_AMBULATORY_CARE_PROVIDER_SITE_OTHER): Payer: BC Managed Care – PPO | Admitting: Nurse Practitioner

## 2020-05-10 ENCOUNTER — Encounter: Payer: Self-pay | Admitting: Nurse Practitioner

## 2020-05-10 VITALS — BP 130/70 | HR 88 | Temp 97.6°F | Ht 70.0 in | Wt 196.0 lb

## 2020-05-10 DIAGNOSIS — M25562 Pain in left knee: Secondary | ICD-10-CM | POA: Diagnosis not present

## 2020-05-10 DIAGNOSIS — M25561 Pain in right knee: Secondary | ICD-10-CM

## 2020-05-10 DIAGNOSIS — G8929 Other chronic pain: Secondary | ICD-10-CM

## 2020-05-10 DIAGNOSIS — M170 Bilateral primary osteoarthritis of knee: Secondary | ICD-10-CM

## 2020-05-10 MED ORDER — ACETAMINOPHEN-CODEINE #2 300-15 MG PO TABS: 1.0000 | ORAL_TABLET | Freq: Four times a day (QID) | ORAL | 0 refills | Status: AC | PRN

## 2020-05-10 NOTE — Progress Notes (Signed)
Subjective:  Patient ID: Veronica Alvarado, female    DOB: 1951-04-03  Age: 69 y.o. MRN: [id number]  CC: Acute Visit (Pt is here for knee pain, pt states pain is at 7, has been ongoing x1 month, pain has gotten worse over time, current meds aren't helping, pt states using heat helps, certain movements make pain more intense. Pt denies any injury )  Knee Pain  The incident occurred more than 1 week ago. There was no injury mechanism. The pain is present in the left knee and right knee. The quality of the pain is described as aching. The pain is severe. The pain has been constant since onset. Pertinent negatives include no inability to bear weight, loss of motion, loss of sensation, muscle weakness, numbness or tingling. She reports no foreign bodies present. The symptoms are aggravated by weight bearing and movement. She has tried acetaminophen, NSAIDs, rest, ice and heat for the symptoms. The treatment provided mild relief.   Reviewed past Medical, Social and Family history today.  Outpatient Medications Prior to Visit  Medication Sig Dispense Refill  . aspirin EC 81 MG tablet Take by mouth.    . blood glucose meter kit and supplies KIT Dispense based on patient and insurance preference. Check glucose once a day before breakfast. ICD-10: E11.65 1 each 0  . cholecalciferol (VITAMIN D3) 25 MCG (1000 UT) tablet Take 1 tablet (1,000 Units total) by mouth 2 (two) times daily.    . dorzolamide (TRUSOPT) 2 % ophthalmic solution INSTILL 1 DROP INTO BOTH EYES TWICE A DAY    . furosemide (LASIX) 20 MG tablet TAKE 1 TABLET BY MOUTH EVERY DAY AS NEEDED 90 tablet 0  . gabapentin (NEURONTIN) 300 MG capsule TAKE 1 CAPSULE BY MOUTH IN AM AND NOON, AND 2 CAPS AT BEDTIME 120 capsule 5  . glipiZIDE (GLUCOTROL XL) 2.5 MG 24 hr tablet TAKE 1 TABLET (2.5 MG TOTAL) BY MOUTH DAILY WITH BREAKFAST. WITH HEAVIEST MEAL 270 tablet 1  . metFORMIN (GLUCOPHAGE) 500 MG tablet Take 1 tablet (500 mg total) by mouth 2 (two) times  daily with a meal. 180 tablet 3  . Multiple Vitamin (MULTI-VITAMIN) tablet Take by mouth.    . Netarsudil-Latanoprost 0.02-0.005 % SOLN Place 1 drop into both eyes nightly.    . meloxicam (MOBIC) 7.5 MG tablet Take 1 tablet (7.5 mg total) by mouth daily. With food (Patient not taking: Reported on 05/10/2020) 90 tablet 1  . HYDROcodone-acetaminophen (NORCO/VICODIN) 5-325 MG tablet Take by mouth. (Patient not taking: Reported on 05/10/2020)     No facility-administered medications prior to visit.    ROS See HPI  Objective:  BP 130/70 (BP Location: Left Arm, Patient Position: Sitting, Cuff Size: Large)   Pulse 88   Temp 97.6 F (36.4 C) (Temporal)   Ht 5' 10" (1.778 m)   Wt 196 lb (88.9 kg)   SpO2 99%   BMI 28.12 kg/m   Physical Exam Vitals reviewed.  Musculoskeletal:        General: Swelling and tenderness present. No deformity or signs of injury.     Right knee: Crepitus present. No swelling, deformity, effusion or erythema. Normal range of motion. Tenderness present over the medial joint line and lateral joint line. No patellar tendon tenderness.     Left knee: Crepitus present. No swelling, deformity, effusion or erythema. Normal range of motion. Tenderness present over the medial joint line and lateral joint line. No patellar tendon tenderness.  Neurological:     Mental Status:  She is alert.    Assessment & Plan:  This visit occurred during the SARS-CoV-2 public health emergency.  Safety protocols were in place, including screening questions prior to the visit, additional usage of staff PPE, and extensive cleaning of exam room while observing appropriate contact time as indicated for disinfecting solutions.   Veronica Alvarado was seen today for acute visit.  Diagnoses and all orders for this visit:  Primary osteoarthritis of both knees -     Ambulatory referral to Sports Medicine -     acetaminophen-codeine (TYLENOL #2) 300-15 MG tablet; Take 1 tablet by mouth every 6 (six) hours as  needed for up to 5 days for moderate pain.  Chronic pain of both knees -     Ambulatory referral to Sports Medicine -     acetaminophen-codeine (TYLENOL #2) 300-15 MG tablet; Take 1 tablet by mouth every 6 (six) hours as needed for up to 5 days for moderate pain.  Stop OTC tylenol while take tylenol with codeine. Continue meloxicam once a day with food.  Problem List Items Addressed This Visit   None   Visit Diagnoses    Primary osteoarthritis of both knees    -  Primary   Relevant Medications   acetaminophen-codeine (TYLENOL #2) 300-15 MG tablet   Other Relevant Orders   Ambulatory referral to Sports Medicine   Chronic pain of both knees       Relevant Medications   acetaminophen-codeine (TYLENOL #2) 300-15 MG tablet   Other Relevant Orders   Ambulatory referral to Sports Medicine      Follow-up: Return if symptoms worsen or fail to improve.  Wilfred Lacy, NP

## 2020-05-10 NOTE — Patient Instructions (Signed)
Stop OTC tylenol while take tylenol with codeine. Continue meloxicam once a day with food.  You will be contacted to schedule appt with sports medicine.

## 2020-05-21 ENCOUNTER — Ambulatory Visit (HOSPITAL_BASED_OUTPATIENT_CLINIC_OR_DEPARTMENT_OTHER)
Admission: RE | Admit: 2020-05-21 | Discharge: 2020-05-21 | Disposition: A | Discharge status: Home / Self Care | Payer: BC Managed Care – PPO | Source: Ambulatory Visit | Attending: Family Medicine | Admitting: Family Medicine

## 2020-05-21 ENCOUNTER — Ambulatory Visit: Payer: Self-pay

## 2020-05-21 ENCOUNTER — Ambulatory Visit (INDEPENDENT_AMBULATORY_CARE_PROVIDER_SITE_OTHER): Payer: BC Managed Care – PPO | Admitting: Family Medicine

## 2020-05-21 ENCOUNTER — Other Ambulatory Visit: Payer: Self-pay | Admitting: Family Medicine

## 2020-05-21 ENCOUNTER — Other Ambulatory Visit: Payer: Self-pay

## 2020-05-21 ENCOUNTER — Encounter: Payer: Self-pay | Admitting: Family Medicine

## 2020-05-21 VITALS — BP 140/70 | Ht 70.0 in | Wt 180.0 lb

## 2020-05-21 DIAGNOSIS — M25461 Effusion, right knee: Secondary | ICD-10-CM | POA: Insufficient documentation

## 2020-05-21 DIAGNOSIS — M179 Osteoarthritis of knee, unspecified: Secondary | ICD-10-CM | POA: Insufficient documentation

## 2020-05-21 DIAGNOSIS — M25462 Effusion, left knee: Secondary | ICD-10-CM

## 2020-05-21 DIAGNOSIS — M1710 Unilateral primary osteoarthritis, unspecified knee: Secondary | ICD-10-CM | POA: Insufficient documentation

## 2020-05-21 DIAGNOSIS — M25562 Pain in left knee: Secondary | ICD-10-CM

## 2020-05-21 MED ORDER — TRIAMCINOLONE ACETONIDE 40 MG/ML IJ SUSP
40.0000 mg | Freq: Once | INTRAMUSCULAR | Status: AC
  Administered 2020-05-21: 11:00:00 40 mg via INTRA_ARTICULAR

## 2020-05-21 NOTE — Progress Notes (Signed)
Laramie Gelles - 69 y.o. female MRN [id number]  Date of birth: 05-Oct-1950  SUBJECTIVE:  Including CC & ROS.  No chief complaint on file.   Jayci Baskett is a 69 y.o. female that is presenting with acute on chronic bilateral knee pain.  She has tried over-the-counter medications with limited improvement.  Denies any injury or inciting event.  Seems to be worse when she is bending down.  No history of knee surgery.  She has had bilateral total hip arthroplasties.  Pain localized to the knee.   Review of Systems See HPI   HISTORY: Past Medical, Surgical, Social, and Family History Reviewed & Updated per EMR.   Pertinent Historical Findings include:  Past Medical History:  Diagnosis Date  . Arthritis   . Diabetes mellitus without complication [facility])     Past Surgical History:  Procedure Laterality Date  . FOOT SURGERY Right 11/2018  . TOTAL HIP ARTHROPLASTY Bilateral 2013  . WRIST SURGERY Right     Family History  Problem Relation Age of Onset  . Sudden death Mother   . Cataracts Father   . Diabetes Father   . Cancer Brother        prostate cancer    Social History   Socioeconomic History  . Marital status: Divorced    Spouse name: Not on file  . Number of children: Not on file  . Years of education: Not on file  . Highest education level: Not on file  Occupational History  . Not on file  Tobacco Use  . Smoking status: Never Smoker  . Smokeless tobacco: Never Used  Vaping Use  . Vaping Use: Never used  Substance and Sexual Activity  . Alcohol use: Never  . Drug use: Never  . Sexual activity: Not on file  Other Topics Concern  . Not on file  Social History Narrative  . Not on file   Social Determinants of Health   Financial Resource Strain: Not on file  Food Insecurity: Not on file  Transportation Needs: Not on file  Physical Activity: Not on file  Stress: Not on file  Social Connections: Not on file  Intimate Partner Violence: Not on file      PHYSICAL EXAM:  VS: BP 140/70   Ht 5\' 10"  (1.778 m)   Wt 180 lb (81.6 kg)   BMI 25.83 kg/m  Physical Exam Gen: NAD, alert, cooperative with exam, well-appearing MSK:  Right and left knee: Effusions bilaterally. Normal range of motion. Normal strength resistance. No instability. Neurovascular intact  Limited ultrasound: Right knee, left knee:  Right knee: Mild effusion in suprapatellar pouch. Calcification appreciated within the suprapatellar pouch. Normal-appearing quadricep patellar tendon. Mild degenerative changes of the lateral meniscus. Mild medial joint space narrowing  Left knee: Moderate effusion. Calcification appreciated within the suprapatellar pouch.  Increased hyperemia associated with the synovium.  Hyperechoic changes to suggest possible gouty contribution. Mild degenerative changes of the lateral meniscus. Medial joint space narrowing  Summary: Effusion bilaterally and changes to possibly related to gouty presence   Ultrasound and interpretation by , MD   Aspiration/Injection Procedure Note Padme Arriaga [birth year]  Procedure: Injection Indications: Right knee pain  Procedure Details Consent: Risks of procedure as well as the alternatives and risks of each were explained to the (patient/caregiver).  Consent for procedure obtained. Time Out: Verified patient identification, verified procedure, site/side was marked, verified correct patient position, special equipment/implants available, medications/allergies/relevent history reviewed, required imaging and test results available.  Performed.  The  area was cleaned with iodine and alcohol swabs.    The right knee superior lateral suprapatellar pouch was injected using 1 cc's of 40 mg Kenalog and 4 cc's of 0.25% bupivacaine with a 25 1 1/2" needle.  Ultrasound was used. Images were obtained in long views showing the injection.     A sterile dressing was applied.  Patient did  tolerate procedure well.   Aspiration/Injection Procedure Note Arline Ketter [birth year]  Procedure: Aspiration and Injection Indications: Left knee pain  Procedure Details Consent: Risks of procedure as well as the alternatives and risks of each were explained to the (patient/caregiver).  Consent for procedure obtained. Time Out: Verified patient identification, verified procedure, site/side was marked, verified correct patient position, special equipment/implants available, medications/allergies/relevent history reviewed, required imaging and test results available.  Performed.  The area was cleaned with iodine and alcohol swabs.    The left knee superior lateral suprapatellar approach was injected using 3 cc of 1% lidocaine on a 25-gauge 1-1/2 inch needle.  An 18-gauge 1-1/2 inch needle was used to achieve aspiration.  The syringe was switched and a mixture containing 1 cc's of 40 mg Kenalog and 4 cc's of 0.25% bupivacaine was injected.  Ultrasound was used. Images were obtained in long views showing the injection.    Amount of Fluid Aspirated: 31mL Character of Fluid: straw colored and cloudy Fluid was sent [id number] count and crystal identification  A sterile dressing was applied.  Patient did tolerate procedure well.    ASSESSMENT & PLAN:   Bilateral knee effusions Acute on chronic in nature.  Has bilateral effusions and changes to suggest a possible gouty presence. -Counseled on home exercise therapy and supportive care. -X-rays. -Injection of right knee.  Aspiration injection of left knee. -Synovial fluid analysis.

## 2020-05-21 NOTE — Addendum Note (Signed)
Addended by: Annita Brod on: 05/21/2020 10:34 AM   Modules accepted: Orders

## 2020-05-21 NOTE — Patient Instructions (Signed)
Nice to meet you Please try ice  Please try the exercises  I will call with the results from today  Please send me a message in MyChart with any questions or updates.  Please see me back in 4 weeks.   --Dr. Asiah Browder  

## 2020-05-21 NOTE — Assessment & Plan Note (Signed)
Acute on chronic in nature.  Has bilateral effusions and changes to suggest a possible gouty presence. -Counseled on home exercise therapy and supportive care. -X-rays. -Injection of right knee.  Aspiration injection of left knee. -Synovial fluid analysis.

## 2020-05-22 LAB — SYNOVIAL FLUID ANALYSIS, COMPLETE
Basophils, %: 0 %
Eosinophils-Synovial: 0 % (ref 0–2)
Lymphocytes-Synovial Fld: 44 % (ref 0–74)
Monocyte/Macrophage: 48 % (ref 0–69)
Neutrophil, Synovial: 2 % (ref 0–24)
Synoviocytes, %: 6 % (ref 0–15)
WBC, Synovial: 289 cells/uL — ABNORMAL HIGH (ref <150)

## 2020-05-23 ENCOUNTER — Telehealth: Payer: Self-pay | Admitting: Family Medicine

## 2020-05-23 NOTE — Telephone Encounter (Signed)
Informed of results.   Myra Rude, MD [facility] 05/23/2020, 8:41 AM

## 2020-06-18 ENCOUNTER — Ambulatory Visit: Payer: BC Managed Care – PPO | Admitting: Family Medicine

## 2020-06-19 ENCOUNTER — Ambulatory Visit (INDEPENDENT_AMBULATORY_CARE_PROVIDER_SITE_OTHER): Payer: BC Managed Care – PPO | Admitting: Family Medicine

## 2020-06-19 ENCOUNTER — Other Ambulatory Visit: Payer: Self-pay

## 2020-06-19 DIAGNOSIS — M170 Bilateral primary osteoarthritis of knee: Secondary | ICD-10-CM | POA: Diagnosis not present

## 2020-06-19 NOTE — Progress Notes (Signed)
  Veronica Alvarado - 70 y.o. female MRN [id number]  Date of birth: 07/07/1950  SUBJECTIVE:  Including CC & ROS.  No chief complaint on file.   Veronica Alvarado is a 70 y.o. female that is following up for her bilateral knee pain. She has had improvement with the steroid injection please has some pain going up and down stairs.  Review of Systems See HPI   HISTORY: Past Medical, Surgical, Social, and Family History Reviewed & Updated per EMR.   Pertinent Historical Findings include:  Past Medical History:  Diagnosis Date  . Arthritis   . Diabetes mellitus without complication [facility])     Past Surgical History:  Procedure Laterality Date  . FOOT SURGERY Right 11/2018  . TOTAL HIP ARTHROPLASTY Bilateral 2013  . WRIST SURGERY Right     Family History  Problem Relation Age of Onset  . Sudden death Mother   . Cataracts Father   . Diabetes Father   . Cancer Brother        prostate cancer    Social History   Socioeconomic History  . Marital status: Divorced    Spouse name: Not on file  . Number of children: Not on file  . Years of education: Not on file  . Highest education level: Not on file  Occupational History  . Not on file  Tobacco Use  . Smoking status: Never Smoker  . Smokeless tobacco: Never Used  Vaping Use  . Vaping Use: Never used  Substance and Sexual Activity  . Alcohol use: Never  . Drug use: Never  . Sexual activity: Not on file  Other Topics Concern  . Not on file  Social History Narrative  . Not on file   Social Determinants of Health   Financial Resource Strain: Not on file  Food Insecurity: Not on file  Transportation Needs: Not on file  Physical Activity: Not on file  Stress: Not on file  Social Connections: Not on file  Intimate Partner Violence: Not on file     PHYSICAL EXAM:  VS: Ht 5\' 10"  (1.778 m)   Wt 180 lb (81.6 kg)   BMI 25.83 kg/m  Physical Exam Gen: NAD, alert, cooperative with exam, well-appearing    ASSESSMENT &  PLAN:   OA (osteoarthritis) of knee Likely related to degenerative changes. May have component of patellofemoral syndrome as well. Has had improvement with the steroid injections. -Counseled on home exercise therapy and supportive care. -Samples of Pennsaid. -Pursue gel injections.

## 2020-06-19 NOTE — Patient Instructions (Signed)
Good to see you Please try the rub on medicine  Please use ice as needed Please continue the exercises   Please send me a message in MyChart with any questions or updates.  We will call once the gel injections are in.   --Dr. Jordan Likes

## 2020-06-19 NOTE — Assessment & Plan Note (Signed)
Likely related to degenerative changes. May have component of patellofemoral syndrome as well. Has had improvement with the steroid injections. -Counseled on home exercise therapy and supportive care. -Samples of Pennsaid. -Pursue gel injections.

## 2020-06-22 ENCOUNTER — Other Ambulatory Visit: Payer: Self-pay

## 2020-06-22 DIAGNOSIS — I10 Essential (primary) hypertension: Secondary | ICD-10-CM

## 2020-06-22 MED ORDER — FUROSEMIDE 20 MG PO TABS: 20.0000 mg | ORAL_TABLET | Freq: Every day | ORAL | 0 refills | Status: DC | PRN

## 2020-06-24 ENCOUNTER — Other Ambulatory Visit: Payer: Self-pay | Admitting: Nurse Practitioner

## 2020-06-24 DIAGNOSIS — E1169 Type 2 diabetes mellitus with other specified complication: Secondary | ICD-10-CM

## 2020-07-04 ENCOUNTER — Other Ambulatory Visit: Payer: Self-pay

## 2020-07-04 ENCOUNTER — Ambulatory Visit: Payer: Self-pay

## 2020-07-04 ENCOUNTER — Ambulatory Visit (INDEPENDENT_AMBULATORY_CARE_PROVIDER_SITE_OTHER): Payer: BC Managed Care – PPO | Admitting: Family Medicine

## 2020-07-04 DIAGNOSIS — M170 Bilateral primary osteoarthritis of knee: Secondary | ICD-10-CM

## 2020-07-04 NOTE — Patient Instructions (Signed)
Good to see you Please try ice as needed   Please send me a message in MyChart with any questions or updates.  Please see me back in 4 weeks.   --Dr. Trelyn Vanderlinde  

## 2020-07-04 NOTE — Assessment & Plan Note (Addendum)
Acute on chronic. Has received steroid injection.  - counseled on home exercise therapy and supportive care  - gel injections today  - could consider physical therapy

## 2020-07-04 NOTE — Progress Notes (Signed)
Veronica Alvarado - 70 y.o. female MRN [id number]  Date of birth: [birth year]  SUBJECTIVE:  Including CC & ROS.  No chief complaint on file.   Veronica Alvarado is a 70 y.o. female that is here for bilateral gel injections.    Review of Systems See HPI   HISTORY: Past Medical, Surgical, Social, and Family History Reviewed & Updated per EMR.   Pertinent Historical Findings include:  Past Medical History:  Diagnosis Date  . Arthritis   . Diabetes mellitus without complication [facility])     Past Surgical History:  Procedure Laterality Date  . FOOT SURGERY Right 11/2018  . TOTAL HIP ARTHROPLASTY Bilateral 2013  . WRIST SURGERY Right     Family History  Problem Relation Age of Onset  . Sudden death Mother   . Cataracts Father   . Diabetes Father   . Cancer Brother        prostate cancer    Social History   Socioeconomic History  . Marital status: Divorced    Spouse name: Not on file  . Number of children: Not on file  . Years of education: Not on file  . Highest education level: Not on file  Occupational History  . Not on file  Tobacco Use  . Smoking status: Never Smoker  . Smokeless tobacco: Never Used  Vaping Use  . Vaping Use: Never used  Substance and Sexual Activity  . Alcohol use: Never  . Drug use: Never  . Sexual activity: Not on file  Other Topics Concern  . Not on file  Social History Narrative  . Not on file   Social Determinants of Health   Financial Resource Strain: Not on file  Food Insecurity: Not on file  Transportation Needs: Not on file  Physical Activity: Not on file  Stress: Not on file  Social Connections: Not on file  Intimate Partner Violence: Not on file     PHYSICAL EXAM:  VS: BP 128/86 (BP Location: Left Arm, Patient Position: Sitting, Cuff Size: Large)   Ht 5\' 10"  (1.778 m)   Wt 180 lb (81.6 kg)   BMI 25.83 kg/m  Physical Exam Gen: NAD, alert, cooperative with exam, well-appearing    Aspiration/Injection Procedure  Note Veronica Alvarado 08/10/1950  Procedure: Injection Indications: right knee pain   Procedure Details Consent: Risks of procedure as well as the alternatives and risks of each were explained to the (patient/caregiver).  Consent for procedure obtained. Time Out: Verified patient identification, verified procedure, site/side was marked, verified correct patient position, special equipment/implants available, medications/allergies/relevent history reviewed, required imaging and test results available.  Performed.  The area was cleaned with iodine and alcohol swabs.    The right knee superior lateral suprapatellar pouch was injected using 4 cc's of 1% lidocaine with a 22 1 1/2" needle.  The syringe was switched and a 60 mg/3 mL was injected. Ultrasound was used. Images were obtained in  Long views showing the injection.    A sterile dressing was applied.  Patient did tolerate procedure well.  Aspiration/Injection Procedure Note Veronica Alvarado 1950/10/19  Procedure: Injection Indications: left knee pain   Procedure Details Consent: Risks of procedure as well as the alternatives and risks of each were explained to the (patient/caregiver).  Consent for procedure obtained. Time Out: Verified patient identification, verified procedure, site/side was marked, verified correct patient position, special equipment/implants available, medications/allergies/relevent history reviewed, required imaging and test results available.  Performed.  The area was cleaned with iodine and alcohol swabs.  The left knee superior lateral suprapatellar pouch was injected using 4 cc's of 1% lidocaine with a 22 1 1/2" needle.  The syringe was switched and a 60 mg/3 mL was injected. Ultrasound was used. Images were obtained in  Long views showing the injection.    A sterile dressing was applied.  Patient did tolerate procedure well.  ASSESSMENT & PLAN:   OA (osteoarthritis) of knee Acute on chronic. Has  received steroid injection.  - counseled on home exercise therapy and supportive care  - gel injections today  - could consider physical therapy

## 2020-07-05 ENCOUNTER — Other Ambulatory Visit: Payer: Self-pay | Admitting: Nurse Practitioner

## 2020-07-28 ENCOUNTER — Other Ambulatory Visit: Payer: Self-pay | Admitting: Nurse Practitioner

## 2020-07-30 ENCOUNTER — Other Ambulatory Visit: Payer: Self-pay | Admitting: Nurse Practitioner

## 2020-07-31 ENCOUNTER — Other Ambulatory Visit: Payer: Self-pay | Admitting: Nurse Practitioner

## 2020-08-01 ENCOUNTER — Ambulatory Visit (INDEPENDENT_AMBULATORY_CARE_PROVIDER_SITE_OTHER): Payer: BC Managed Care – PPO | Admitting: Family Medicine

## 2020-08-01 ENCOUNTER — Other Ambulatory Visit: Payer: Self-pay

## 2020-08-01 VITALS — BP 150/82 | Ht 70.0 in | Wt 180.0 lb

## 2020-08-01 DIAGNOSIS — M170 Bilateral primary osteoarthritis of knee: Secondary | ICD-10-CM

## 2020-08-01 NOTE — Assessment & Plan Note (Signed)
Has got improvement of the symptoms with the gel injections and steroid.  Pain is still mild around the patellofemoral joint. -Counseled on home exercise therapy and supportive care. -Green sport insoles. -Provided Pennsaid samples. -Could consider further imaging if needed.

## 2020-08-01 NOTE — Progress Notes (Signed)
  [name] - 70 y.o. female MRN [id number]  Date of birth: February 09, 1951  SUBJECTIVE:  Including CC & ROS.  No chief complaint on file.   Veronica Alvarado is a 70 y.o. female that is following up for her bilateral knee pain.  We have tried steroid injections and gel injections.  Has minimal pain that is more normal in nature.  She has tried physical therapy previously.   Review of Systems See HPI   HISTORY: Past Medical, Surgical, Social, and Family History Reviewed & Updated per EMR.   Pertinent Historical Findings include:  Past Medical History:  Diagnosis Date  . Arthritis   . Diabetes mellitus without complication [facility])     Past Surgical History:  Procedure Laterality Date  . FOOT SURGERY Right 11/2018  . TOTAL HIP ARTHROPLASTY Bilateral 2013  . WRIST SURGERY Right     Family History  Problem Relation Age of Onset  . Sudden death Mother   . Cataracts Father   . Diabetes Father   . Cancer Brother        prostate cancer    Social History   Socioeconomic History  . Marital status: Divorced    Spouse name: Not on file  . Number of children: Not on file  . Years of education: Not on file  . Highest education level: Not on file  Occupational History  . Not on file  Tobacco Use  . Smoking status: Never Smoker  . Smokeless tobacco: Never Used  Vaping Use  . Vaping Use: Never used  Substance and Sexual Activity  . Alcohol use: Never  . Drug use: Never  . Sexual activity: Not on file  Other Topics Concern  . Not on file  Social History Narrative  . Not on file   Social Determinants of Health   Financial Resource Strain: Not on file  Food Insecurity: Not on file  Transportation Needs: Not on file  Physical Activity: Not on file  Stress: Not on file  Social Connections: Not on file  Intimate Partner Violence: Not on file     PHYSICAL EXAM:  VS: BP (!) 150/82   Ht 5\' 10"  (1.778 m)   Wt 180 lb (81.6 kg)   BMI 25.83 kg/m  Physical Exam Gen: NAD,  alert, cooperative with exam, well-appearing MSK:  Right and left knee: No obvious effusion. Normal strength resistance. Neurovascular intact     ASSESSMENT & PLAN:   OA (osteoarthritis) of knee Has got improvement of the symptoms with the gel injections and steroid.  Pain is still mild around the patellofemoral joint. -Counseled on home exercise therapy and supportive care. -Green sport insoles. -Provided Pennsaid samples. -Could consider further imaging if needed.

## 2020-08-01 NOTE — Progress Notes (Unsigned)
Medication Samples have been provided to the patient.  Drug name: Pennsaid      Strength: 2%      Qty: 2 boxes LOT: [id number]  Exp.Date:  09/2020  Dosing instructions: Use a pea sized amount twice daily   The patient has been instructed regarding the correct time, dose, and frequency of taking this medication, including desired effects and most common side effects.   Lanier Prude 10:55 AM 08/01/2020

## 2020-08-01 NOTE — Patient Instructions (Signed)
Good to see you Please try the insoles. You can put them in different shoes  Please use ice as needed  Please try the rub on medicine   Please send me a message in MyChart with any questions or updates.  Please see me back in 6-8 weeks.   --Dr. Jordan Likes

## 2020-08-02 ENCOUNTER — Other Ambulatory Visit: Payer: Self-pay | Admitting: Nurse Practitioner

## 2020-08-13 NOTE — Progress Notes (Signed)
Subjective:   Veronica Alvarado is a 70 y.o. female who presents for an Initial Medicare Annual Wellness Visit.  I connected with Kilah today by telephone and verified that I am speaking with the correct person using two identifiers. Location patient: home Location provider: work Persons participating in the virtual visit: patient, Marine scientist.    I discussed the limitations, risks, security and privacy concerns of performing an evaluation and management service by telephone and the availability of in person appointments. I also discussed with the patient that there may be a patient responsible charge related to this service. The patient expressed understanding and verbally consented to this telephonic visit.    Interactive audio and video telecommunications were attempted between this provider and patient, however failed, due to patient having technical difficulties OR patient did not have access to video capability.  We continued and completed visit with audio only.  Some vital signs may be absent or patient reported.   Time Spent with patient on telephone encounter: 25 minutes   Review of Systems     Cardiac Risk Factors include: advanced age (>65mn, >>16women);hypertension;dyslipidemia;diabetes mellitus     Objective:    Today's Vitals   08/14/20 1114  Weight: 180 lb (81.6 kg)  Height: 5' 10"  (1.778 m)   Body mass index is 25.83 kg/m.  Advanced Directives 08/14/2020  Does Patient Have a Medical Advance Directive? No  Would patient like information on creating a medical advance directive? Yes (MAU/Ambulatory/Procedural Areas - Information given)    Current Medications (verified) Outpatient Encounter Medications as of 08/14/2020  Medication Sig   ACCU-CHEK GUIDE test strip CHECK BLOOD SUGAR ONCE A DAY BEFORE BREAKFAST   Accu-Chek Softclix Lancets lancets Use once a day before breakfast E11.65   aspirin EC 81 MG tablet Take by mouth.   blood glucose meter kit and supplies  KIT Dispense based on patient and insurance preference. Check glucose once a day before breakfast. ICD-10: E11.65   cholecalciferol (VITAMIN D3) 25 MCG (1000 UT) tablet Take 1 tablet (1,000 Units total) by mouth 2 (two) times daily.   dorzolamide (TRUSOPT) 2 % ophthalmic solution INSTILL 1 DROP INTO BOTH EYES TWICE A DAY   furosemide (LASIX) 20 MG tablet Take 1 tablet (20 mg total) by mouth daily as needed.   gabapentin (NEURONTIN) 300 MG capsule TAKE 1 CAPSULE BY MOUTH IN AM AND NOON, AND 2 CAPS AT BEDTIME   glipiZIDE (GLUCOTROL XL) 2.5 MG 24 hr tablet TAKE 1 TABLET (2.5 MG TOTAL) BY MOUTH DAILY WITH BREAKFAST. WITH HEAVIEST MEAL   metFORMIN (GLUCOPHAGE) 500 MG tablet Take 1 tablet (500 mg total) by mouth 2 (two) times daily with a meal.   Multiple Vitamin (MULTI-VITAMIN) tablet Take by mouth.   Netarsudil-Latanoprost 0.02-0.005 % SOLN Place 1 drop into both eyes nightly.   meloxicam (MOBIC) 7.5 MG tablet Take 1 tablet (7.5 mg total) by mouth daily. With food (Patient not taking: No sig reported)   No facility-administered encounter medications on file as of 08/14/2020.    Allergies (verified) Other, Penicillins, Lisinopril, Brimonidine, Latex, and Simvastatin   History: Past Medical History:  Diagnosis Date   Arthritis    Diabetes mellitus without complication ([facility]    Past Surgical History:  Procedure Laterality Date   FOOT SURGERY Right 11/2018   TOTAL HIP ARTHROPLASTY Bilateral 2013   WRIST SURGERY Right    Family History  Problem Relation Age of Onset   Sudden death Mother    Cataracts Father  Diabetes Father    Cancer Brother        prostate cancer   Social History   Socioeconomic History   Marital status: Divorced    Spouse name: Not on file   Number of children: Not on file   Years of education: Not on file   Highest education level: Not on file  Occupational History   Not on file  Tobacco Use   Smoking status: Never Smoker    Smokeless tobacco: Never Used  Vaping Use   Vaping Use: Never used  Substance and Sexual Activity   Alcohol use: Never   Drug use: Never   Sexual activity: Not on file  Other Topics Concern   Not on file  Social History Narrative   Not on file   Social Determinants of Health   Financial Resource Strain: Low Risk    Difficulty of Paying Living Expenses: Not hard at all  Food Insecurity: No Food Insecurity   Worried About Charity fundraiser in the Last Year: Never true   [place] in the Last Year: Never true  Transportation Needs: No Transportation Needs   Lack of Transportation (Medical): No   Lack of Transportation (Non-Medical): No  Physical Activity: Inactive   Days of Exercise per Week: 0 days   Minutes of Exercise per Session: 0 min  Stress: No Stress Concern Present   Feeling of Stress : Not at all  Social Connections: Moderately Isolated   Frequency of Communication with Friends and Family: More than three times a week   Frequency of Social Gatherings with Friends and Family: More than three times a week   Attends Religious Services: 1 to 4 times per year   Active Member of [employer] or Organizations: No   Attends Music therapist: Never   Marital Status: Divorced    Tobacco Counseling Counseling given: Not Answered   Clinical Intake:  Pre-visit preparation completed: Yes  Pain : 0-10 Pain Type: Chronic pain Pain Location: Arm Pain Orientation: Right Pain Descriptors / Indicators: Tingling Pain Onset: More than a month ago Pain Frequency: Constant     Nutritional Status: BMI 25 -29 Overweight Nutritional Risks: None Diabetes: Yes CBG done?: No Did pt. bring in CBG monitor from home?: No (phone visit)  How often do you need to have someone help you when you read instructions, pamphlets, or other written materials from your doctor or pharmacy?: 1 - Never  Diabetes:  Is the patient diabetic?  Yes  If diabetic,  was a CBG obtained today?  No  Did the patient bring in their glucometer from home?  No phone visit How often do you monitor your CBG's? daily.   Financial Strains and Diabetes Management:  Are you having any financial strains with the device, your supplies or your medication? No .  Does the patient want to be seen by Chronic Care Management for management of their diabetes?  No  Would the patient like to be referred to a Nutritionist or for Diabetic Management?  No   Diabetic Exams:  Diabetic Eye Exam: Completed 09/14/2019.   Diabetic Foot Exam: Completed 10/05/2019.    Interpreter Needed?: No  Information entered by :: Caroleen Hamman LPN   Activities of Daily Living In your present state of health, do you have any difficulty performing the following activities: 08/14/2020  Hearing? N  Vision? N  Difficulty concentrating or making decisions? N  Walking or climbing stairs? N  Dressing or bathing? N  Doing errands, shopping? N  Preparing Food and eating ? N  Using the Toilet? N  In the past six months, have you accidently leaked urine? N  Do you have problems with loss of bowel control? N  Managing your Medications? N  Managing your Finances? N  Housekeeping or managing your Housekeeping? N  Some recent data might be hidden    Patient Care Team: Nche, Charlene Brooke, NP as PCP - General (Internal Medicine)  Indicate any recent Medical Services you may have received from other than [facility] providers in the past year (date may be approximate).     Assessment:   This is a routine wellness examination for Veronica Alvarado.  Hearing/Vision screen  Hearing Screening   125Hz  250Hz  500Hz  1000Hz  2000Hz  3000Hz  4000Hz  6000Hz  8000Hz   Right ear:           Left ear:           Comments: No issues  Vision Screening Comments: Wears glasses Last eye exam-09/2019  Dietary issues and exercise activities discussed: Current Exercise Habits: The patient does not participate in regular exercise at  present, Exercise limited by: None identified  Goals     Patient Stated     Increase activity      Depression Screen PHQ 2/9 Scores 08/14/2020 10/05/2019 04/25/2019  PHQ - 2 Score 1 0 0    Fall Risk Fall Risk  08/14/2020 05/10/2020 04/09/2020 10/05/2019 04/25/2019  Falls in the past year? 1 0 0 0 0  Number falls in past yr: 0 0 - 0 -  Injury with Fall? 0 0 - 0 -  Follow up Falls prevention discussed - - - -    FALL RISK PREVENTION PERTAINING TO THE HOME:  Any stairs in or around the home? No  Home free of loose throw rugs in walkways, pet beds, electrical cords, etc? Yes  Adequate lighting in your home to reduce risk of falls? Yes   ASSISTIVE DEVICES UTILIZED TO PREVENT FALLS:  Life alert? No  Use of a cane, walker or w/c? No  Grab bars in the bathroom? No  Shower chair or bench in shower? No  Elevated toilet seat or a handicapped toilet? No   TIMED UP AND GO:  Was the test performed? No . Phone visit   Cognitive Function:Normal cognitive status assessed by  this Nurse Health Advisor. No abnormalities found.          Immunizations Immunization History  Administered Date(s) Administered   Influenza Split 03/23/2009, 03/26/2010   Influenza, High Dose Seasonal PF 01/23/2019, 03/16/2020   Influenza,inj,Quad PF,6+ Mos 02/20/2015, 03/10/2016, 02/20/2017, 01/28/2018   Influenza-Unspecified 03/23/2009, 03/26/2010, 02/07/2020   PFIZER(Purple Top)SARS-COV-2 Vaccination 06/24/2019, 07/15/2019, 05/02/2020   PPD Test 03/10/2016   Pneumococcal Conjugate-13 03/10/2016   Pneumococcal Polysaccharide-23 10/10/2004, 10/19/2014, 05/08/2017   Tdap 10/22/2010    TDAP status: Up to date  Flu Vaccine status: Up to date  Pneumococcal vaccine status: Up to date  Covid-19 vaccine status: Completed vaccines  Qualifies for Shingles Vaccine? Yes   Zostavax completed No   Shingrix Completed?: No.    Education has been provided regarding the importance of this vaccine. Patient  has been advised to call insurance company to determine out of pocket expense if they have not yet received this vaccine. Advised may also receive vaccine at local pharmacy or Health Dept. Verbalized acceptance and understanding.  Screening Tests Health Maintenance  Topic Date Due   OPHTHALMOLOGY EXAM  09/13/2020   FOOT EXAM  10/04/2020  HEMOGLOBIN A1C  10/07/2020   TETANUS/TDAP  10/21/2020   URINE MICROALBUMIN  04/09/2021   MAMMOGRAM  03/26/2022   COLONOSCOPY (Pts 45-52yr Insurance coverage will need to be confirmed)  07/03/2026   INFLUENZA VACCINE  Completed   DEXA SCAN  Completed   COVID-19 Vaccine  Completed   Hepatitis C Screening  Completed   PNA vac Low Risk Adult  Completed   HPV VACCINES  Aged Out    Health Maintenance  There are no preventive care reminders to display for this patient.  Colorectal cancer screening: Type of screening: Colonoscopy. Completed 07/03/2016. Repeat every 10 years  Mammogram status: Completed Bilateral 03/26/2020. Repeat every year  Bone Density status: Completed 07/25/2019. Results reflect: Bone density results: NORMAL. Repeat every 2 years.  Lung Cancer Screening: (Low Dose CT Chest recommended if Age 71-80years, 30 pack-year currently smoking OR have quit w/in 15years.) does not qualify.     Additional Screening:  Hepatitis C Screening:Completed 03/10/2016  Vision Screening: Recommended annual ophthalmology exams for early detection of glaucoma and other disorders of the eye. Is the patient up to date with their annual eye exam?  Yes  Who is the provider or what is the name of the office in which the patient attends annual eye exams? Dr. RAntionette Fairy  Dental Screening: Recommended annual dental exams for proper oral hygiene  Community Resource Referral / Chronic Care Management: CRR required this visit?  No   CCM required this visit?  No      Plan:     I have personally reviewed and noted the following in the  patients chart:    Medical and social history  Use of alcohol, tobacco or illicit drugs   Current medications and supplements  Functional ability and status  Nutritional status  Physical activity  Advanced directives  List of other physicians  Hospitalizations, surgeries, and ER visits in previous 12 months  Vitals  Screenings to include cognitive, depression, and falls  Referrals and appointments  In addition, I have reviewed and discussed with patient certain preventive protocols, quality metrics, and best practice recommendations. A written personalized care plan for preventive services as well as general preventive health recommendations were provided to patient.   Due to this being a telephonic visit, the after visit summary with patients personalized plan was offered to patient via mail or my-chart. Patient preferred to pick up at office at next visit.   MMarta Antu LPN   39/39/0300 Nurse Health Advisor  Nurse Notes: Patient c/o tingling in her right arm & hand x several months. Appt made with PCP.

## 2020-08-14 ENCOUNTER — Ambulatory Visit (INDEPENDENT_AMBULATORY_CARE_PROVIDER_SITE_OTHER): Payer: BC Managed Care – PPO

## 2020-08-14 VITALS — Ht 70.0 in | Wt 180.0 lb

## 2020-08-14 DIAGNOSIS — Z0000 Encounter for general adult medical examination without abnormal findings: Secondary | ICD-10-CM | POA: Diagnosis not present

## 2020-08-14 NOTE — Patient Instructions (Signed)
[name] , Thank you for taking time to complete your Medicare Wellness Visit. I appreciate your ongoing commitment to your health goals. Please review the following plan we discussed and let me know if I can assist you in the future.   Screening recommendations/referrals: Colonoscopy: Completed 07/03/2016-Due 07/03/2026 Mammogram: Completed 03/26/2020-Due 03/26/2021 Bone Density: Completed 07/25/2019-Due 07/24/2021 Recommended yearly ophthalmology/optometry visit for glaucoma screening and checkup Recommended yearly dental visit for hygiene and checkup  Vaccinations: Influenza vaccine: Up to date Pneumococcal vaccine: Completed vaccines Tdap vaccine: Due 10/21/2020-Discuss with pharmacy Shingles vaccine: Discuss with pharmacy  Covid-19:Completed vaccines  Advanced directives: Information mailed today  Conditions/risks identified: See problem list  Next appointment: Follow up in one year for your annual wellness visit 08/20/2021 @ 11:15   Preventive Care 65 Years and Older, Female Preventive care refers to lifestyle choices and visits with your health care provider that can promote health and wellness. What does preventive care include?  A yearly physical exam. This is also called an annual well check.  Dental exams once or twice a year.  Routine eye exams. Ask your health care provider how often you should have your eyes checked.  Personal lifestyle choices, including:  Daily care of your teeth and gums.  Regular physical activity.  Eating a healthy diet.  Avoiding tobacco and drug use.  Limiting alcohol use.  Practicing safe sex.  Taking low-dose aspirin every day.  Taking vitamin and mineral supplements as recommended by your health care provider. What happens during an annual well check? The services and screenings done by your health care provider during your annual well check will depend on your age, overall health, lifestyle risk factors, and family history of  disease. Counseling  Your health care provider may ask you questions about your:  Alcohol use.  Tobacco use.  Drug use.  Emotional well-being.  Home and relationship well-being.  Sexual activity.  Eating habits.  History of falls.  Memory and ability to understand (cognition).  Work and work Astronomer.  Reproductive health. Screening  You may have the following tests or measurements:  Height, weight, and BMI.  Blood pressure.  Lipid and cholesterol levels. These may be checked every 5 years, or more frequently if you are over 53 years old.  Skin check.  Lung cancer screening. You may have this screening every year starting at age 30 if you have a 30-pack-year history of smoking and currently smoke or have quit within the past 15 years.  Fecal occult blood test (FOBT) of the stool. You may have this test every year starting at age 2.  Flexible sigmoidoscopy or colonoscopy. You may have a sigmoidoscopy every 5 years or a colonoscopy every 10 years starting at age 19.  Hepatitis C blood test.  Hepatitis B blood test.  Sexually transmitted disease (STD) testing.  Diabetes screening. This is done by checking your blood sugar (glucose) after you have not eaten for a while (fasting). You may have this done every 1-3 years.  Bone density scan. This is done to screen for osteoporosis. You may have this done starting at age 75.  Mammogram. This may be done every 1-2 years. Talk to your health care provider about how often you should have regular mammograms. Talk with your health care provider about your test results, treatment options, and if necessary, the need for more tests. Vaccines  Your health care provider may recommend certain vaccines, such as:  Influenza vaccine. This is recommended every year.  Tetanus, diphtheria, and acellular  pertussis (Tdap, Td) vaccine. You may need a Td booster every 10 years.  Zoster vaccine. You may need this after age  33.  Pneumococcal 13-valent conjugate (PCV13) vaccine. One dose is recommended after age 97.  Pneumococcal polysaccharide (PPSV23) vaccine. One dose is recommended after age 12. Talk to your health care provider about which screenings and vaccines you need and how often you need them. This information is not intended to replace advice given to you by your health care provider. Make sure you discuss any questions you have with your health care provider. Document Released: 06/15/2015 Document Revised: 02/06/2016 Document Reviewed: 03/20/2015 Elsevier Interactive Patient Education  2017 [employer] Prevention in the Home Falls can cause injuries. They can happen to people of all ages. There are many things you can do to make your home safe and to help prevent falls. What can I do on the outside of my home?  Regularly fix the edges of walkways and driveways and fix any cracks.  Remove anything that might make you trip as you walk through a door, such as a raised step or threshold.  Trim any bushes or trees on the path to your home.  Use bright outdoor lighting.  Clear any walking paths of anything that might make someone trip, such as rocks or tools.  Regularly check to see if handrails are loose or broken. Make sure that both sides of any steps have handrails.  Any raised decks and porches should have guardrails on the edges.  Have any leaves, snow, or ice cleared regularly.  Use sand or salt on walking paths during winter.  Clean up any spills in your garage right away. This includes oil or grease spills. What can I do in the bathroom?  Use night lights.  Install grab bars by the toilet and in the tub and shower. Do not use towel bars as grab bars.  Use non-skid mats or decals in the tub or shower.  If you need to sit down in the shower, use a plastic, non-slip stool.  Keep the floor dry. Clean up any water that spills on the floor as soon as it happens.  Remove  soap buildup in the tub or shower regularly.  Attach bath mats securely with double-sided non-slip rug tape.  Do not have throw rugs and other things on the floor that can make you trip. What can I do in the bedroom?  Use night lights.  Make sure that you have a light by your bed that is easy to reach.  Do not use any sheets or blankets that are too big for your bed. They should not hang down onto the floor.  Have a firm chair that has side arms. You can use this for support while you get dressed.  Do not have throw rugs and other things on the floor that can make you trip. What can I do in the kitchen?  Clean up any spills right away.  Avoid walking on wet floors.  Keep items that you use a lot in easy-to-reach places.  If you need to reach something above you, use a strong step stool that has a grab bar.  Keep electrical cords out of the way.  Do not use floor polish or wax that makes floors slippery. If you must use wax, use non-skid floor wax.  Do not have throw rugs and other things on the floor that can make you trip. What can I do with my stairs?  Do not leave any items on the stairs.  Make sure that there are handrails on both sides of the stairs and use them. Fix handrails that are broken or loose. Make sure that handrails are as long as the stairways.  Check any carpeting to make sure that it is firmly attached to the stairs. Fix any carpet that is loose or worn.  Avoid having throw rugs at the top or bottom of the stairs. If you do have throw rugs, attach them to the floor with carpet tape.  Make sure that you have a light switch at the top of the stairs and the bottom of the stairs. If you do not have them, ask someone to add them for you. What else can I do to help prevent falls?  Wear shoes that:  Do not have high heels.  Have rubber bottoms.  Are comfortable and fit you well.  Are closed at the toe. Do not wear sandals.  If you use a  stepladder:  Make sure that it is fully opened. Do not climb a closed stepladder.  Make sure that both sides of the stepladder are locked into place.  Ask someone to hold it for you, if possible.  Clearly mark and make sure that you can see:  Any grab bars or handrails.  First and last steps.  Where the edge of each step is.  Use tools that help you move around (mobility aids) if they are needed. These include:  Canes.  Walkers.  Scooters.  Crutches.  Turn on the lights when you go into a dark area. Replace any light bulbs as soon as they burn out.  Set up your furniture so you have a clear path. Avoid moving your furniture around.  If any of your floors are uneven, fix them.  If there are any pets around you, be aware of where they are.  Review your medicines with your doctor. Some medicines can make you feel dizzy. This can increase your chance of falling. Ask your doctor what other things that you can do to help prevent falls. This information is not intended to replace advice given to you by your health care provider. Make sure you discuss any questions you have with your health care provider. Document Released: 03/15/2009 Document Revised: 10/25/2015 Document Reviewed: 06/23/2014 Elsevier Interactive Patient Education  2017 [employer].

## 2020-08-15 ENCOUNTER — Telehealth: Payer: Self-pay | Admitting: *Deleted

## 2020-08-15 NOTE — Telephone Encounter (Signed)
   Telephone encounter was:  Successful.  08/15/2020 Name: Veronica Alvarado MRN: [id number] DOB: 12-May-1951  Veronica Alvarado is a 70 y.o. year old female who is a primary care patient of Nche, Bonna Gains, NP . The community resource team was consulted for assistance with exercise classes through [facility] guide performed the following interventions: Patient provided with information about care guide support team and interviewed to confirm resource needs Discussed resources to assist with Silver Sneakers , mailed all requested information  Follow up call placed to community resources to determine status of patients referral.  Follow Up Plan:  No further follow up planned at this time. The patient has been provided with needed resources. Alois Cliche -[facility] Guide , Embedded Care Coordination [facility], Care Management  [phone] [address] [place] , [place] [address] Email : [email or web address]

## 2020-08-15 NOTE — Telephone Encounter (Signed)
   Telephone encounter was:  Successful.  08/15/2020 Name: Veronica Alvarado MRN: [id number] DOB: 05-10-1951  Veronica Alvarado is a 70 y.o. year old female who is a primary care patient of Nche, Bonna Gains, NP . The community resource team was consulted for assistance with Exercise programs   Care guide performed the following interventions: Patient provided with information about care guide support team and interviewed to confirm resource needs.  Follow Up Plan:  No further follow up planned at this time. The patient has been provided with needed resources.  Alois Cliche -[facility] Guide , Embedded Care Coordination [facility], Care Management  [phone] [address] [place] , [place] [address] Email : [email or web address]

## 2020-09-19 ENCOUNTER — Ambulatory Visit (INDEPENDENT_AMBULATORY_CARE_PROVIDER_SITE_OTHER): Payer: BC Managed Care – PPO | Admitting: Nurse Practitioner

## 2020-09-19 ENCOUNTER — Other Ambulatory Visit: Payer: Self-pay | Admitting: Nurse Practitioner

## 2020-09-19 ENCOUNTER — Other Ambulatory Visit: Payer: Self-pay

## 2020-09-19 ENCOUNTER — Ambulatory Visit (INDEPENDENT_AMBULATORY_CARE_PROVIDER_SITE_OTHER): Payer: BC Managed Care – PPO

## 2020-09-19 ENCOUNTER — Encounter: Payer: Self-pay | Admitting: Nurse Practitioner

## 2020-09-19 VITALS — BP 136/74 | HR 80 | Temp 97.3°F | Wt 198.2 lb

## 2020-09-19 DIAGNOSIS — M5030 Other cervical disc degeneration, unspecified cervical region: Secondary | ICD-10-CM | POA: Diagnosis not present

## 2020-09-19 DIAGNOSIS — M5412 Radiculopathy, cervical region: Secondary | ICD-10-CM

## 2020-09-19 DIAGNOSIS — Z23 Encounter for immunization: Secondary | ICD-10-CM

## 2020-09-19 DIAGNOSIS — H1013 Acute atopic conjunctivitis, bilateral: Secondary | ICD-10-CM

## 2020-09-19 DIAGNOSIS — R200 Anesthesia of skin: Secondary | ICD-10-CM

## 2020-09-19 DIAGNOSIS — I10 Essential (primary) hypertension: Secondary | ICD-10-CM

## 2020-09-19 DIAGNOSIS — E1142 Type 2 diabetes mellitus with diabetic polyneuropathy: Secondary | ICD-10-CM

## 2020-09-19 LAB — HM DIABETES EYE EXAM

## 2020-09-19 MED ORDER — PREDNISONE 20 MG PO TABS: ORAL_TABLET | ORAL | 0 refills | Status: AC

## 2020-09-19 MED ORDER — GABAPENTIN 300 MG PO CAPS
ORAL_CAPSULE | ORAL | 5 refills | Status: AC
Start: 2020-09-19 — End: ?

## 2020-09-19 MED ORDER — OLOPATADINE HCL 0.2 % OP SOLN: 1.0000 [drp] | Freq: Every day | OPHTHALMIC | 0 refills | Status: AC

## 2020-09-19 MED ORDER — TETANUS-DIPHTH-ACELL PERTUSSIS 5-2.5-18.5 LF-MCG/0.5 IM SUSP: 0.5000 mL | Freq: Once | INTRAMUSCULAR | 0 refills | Status: DC

## 2020-09-19 MED ORDER — LORATADINE 10 MG PO TABS: 10.0000 mg | ORAL_TABLET | Freq: Every day | ORAL | 0 refills | Status: AC

## 2020-09-19 MED ORDER — TETANUS-DIPHTH-ACELL PERTUSSIS 5-2.5-18.5 LF-MCG/0.5 IM SUSP: 0.5000 mL | Freq: Once | INTRAMUSCULAR | 0 refills | Status: AC

## 2020-09-19 NOTE — Telephone Encounter (Signed)
Forward to Dr. Doreene Burke

## 2020-09-19 NOTE — Patient Instructions (Signed)
Go to lab for cervical spine x-ray Continue meloxicam for arthritic pain

## 2020-09-19 NOTE — Progress Notes (Signed)
Subjective:  Patient ID: Veronica Alvarado, female    DOB: 1950/10/08  Age: 70 y.o. MRN: [id number]  CC: Acute Visit (Tingling in right arm, pt states its been going on for more than a few weeks, sleeping on that ide causes numbness,pt denies injury pt would like a tetanus shot )  Neck Pain  This is a chronic problem. The current episode started more than 1 year ago. The problem occurs intermittently. The problem has been gradually worsening. The pain is associated with nothing. The pain is present in the right side. The quality of the pain is described as burning, aching and shooting. Exacerbated by: laying on right side. Associated symptoms include numbness and tingling. Pertinent negatives include no chest pain, fever, headaches, paresis, photophobia, trouble swallowing or weakness. She has tried nothing for the symptoms.  Eye Problem  Both eyes are affected.This is a new problem. The current episode started 1 to 4 weeks ago. The problem occurs constantly. The problem has been unchanged. There is no known exposure to pink eye. She does not wear contacts. Associated symptoms include eye redness, a foreign body sensation, itching and tingling. Pertinent negatives include no blurred vision, eye discharge, fever, nausea, photophobia, recent URI, vomiting or weakness. She has tried nothing for the symptoms.   Reviewed past Medical, Social and Family history today.  Outpatient Medications Prior to Visit  Medication Sig Dispense Refill  . ACCU-CHEK GUIDE test strip CHECK BLOOD SUGAR ONCE A DAY BEFORE BREAKFAST 100 strip 0  . Accu-Chek Softclix Lancets lancets Use once a day before breakfast E11.65 100 each 0  . aspirin EC 81 MG tablet Take by mouth.    . blood glucose meter kit and supplies KIT Dispense based on patient and insurance preference. Check glucose once a day before breakfast. ICD-10: E11.65 1 each 0  . cholecalciferol (VITAMIN D3) 25 MCG (1000 UT) tablet Take 1 tablet (1,000 Units total)  by mouth 2 (two) times daily.    . dorzolamide (TRUSOPT) 2 % ophthalmic solution INSTILL 1 DROP INTO BOTH EYES TWICE A DAY    . furosemide (LASIX) 20 MG tablet Take 1 tablet (20 mg total) by mouth daily as needed. 90 tablet 0  . glipiZIDE (GLUCOTROL XL) 2.5 MG 24 hr tablet TAKE 1 TABLET (2.5 MG TOTAL) BY MOUTH DAILY WITH BREAKFAST. WITH HEAVIEST MEAL 270 tablet 1  . meloxicam (MOBIC) 7.5 MG tablet Take 1 tablet (7.5 mg total) by mouth daily. With food 90 tablet 1  . metFORMIN (GLUCOPHAGE) 500 MG tablet Take 1 tablet (500 mg total) by mouth 2 (two) times daily with a meal. 180 tablet 3  . Multiple Vitamin (MULTI-VITAMIN) tablet Take by mouth.    . Netarsudil-Latanoprost 0.02-0.005 % SOLN Place 1 drop into both eyes nightly.    . gabapentin (NEURONTIN) 300 MG capsule TAKE 1 CAPSULE BY MOUTH IN AM AND NOON, AND 2 CAPS AT BEDTIME 120 capsule 5   No facility-administered medications prior to visit.    ROS See HPI  Objective:  BP 136/74 (BP Location: Left Arm, Patient Position: Sitting, Cuff Size: Large)   Pulse 80   Temp (!) 97.3 F (36.3 C) (Oral)   Wt 198 lb 3.2 oz (89.9 kg)   SpO2 98%   BMI 28.44 kg/m   Physical Exam Vitals reviewed.  Cardiovascular:     Rate and Rhythm: Normal rate.  Pulmonary:     Effort: Pulmonary effort is normal.  Musculoskeletal:        General: Tenderness  present. No swelling. Normal range of motion.     Right shoulder: Tenderness present. No swelling, effusion, bony tenderness or crepitus. Normal range of motion. Normal strength. Normal pulse.     Left shoulder: Normal.     Right upper arm: Normal.     Left upper arm: Normal.     Right elbow: Normal.     Left elbow: Normal.     Right forearm: Normal.     Left forearm: Normal.     Right wrist: Normal.     Left wrist: Normal.     Right hand: Normal.     Left hand: Normal.     Cervical back: No rigidity, torticollis, tenderness, bony tenderness or crepitus. Pain with movement present. Normal range of  motion.     Right lower leg: No edema.     Left lower leg: No edema.  Skin:    General: Skin is warm and dry.     Findings: No erythema or rash.  Neurological:     Mental Status: She is alert and oriented to person, place, and time.  Psychiatric:        Mood and Affect: Mood normal.        Behavior: Behavior normal.    Assessment & Plan:  This visit occurred during the SARS-CoV-2 public health emergency.  Safety protocols were in place, including screening questions prior to the visit, additional usage of staff PPE, and extensive cleaning of exam room while observing appropriate contact time as indicated for disinfecting solutions.   Veronica Alvarado was seen today for acute visit.  Diagnoses and all orders for this visit:  Cervical radiculopathy -     DG Cervical Spine Complete  Allergic conjunctivitis of both eyes -     Olopatadine HCl 0.2 % SOLN; Apply 1 drop to eye daily. -     loratadine (CLARITIN) 10 MG tablet; Take 1 tablet (10 mg total) by mouth daily.  Need for diphtheria-tetanus-pertussis (Tdap) vaccine -     Discontinue: Tdap (BOOSTRIX) 5-2.5-18.5 LF-MCG/0.5 injection; Inject 0.5 mLs into the muscle once for 1 dose. -     Tdap (BOOSTRIX) 5-2.5-18.5 LF-MCG/0.5 injection; Inject 0.5 mLs into the muscle once for 1 dose.  DDD (degenerative disc disease), cervical  Degenerative disc disease with bone spurr and narrowing of disc space. This is cause of your symptoms. Entered referral to ortho spine, sent gabapentin and short course of prednisone.  Problem List Items Addressed This Visit      Nervous and Auditory   Cervical radiculopathy - Primary   Relevant Orders   DG Cervical Spine Complete (Completed)     Musculoskeletal and Integument   DDD (degenerative disc disease), cervical    Other Visit Diagnoses    Allergic conjunctivitis of both eyes       Relevant Medications   Olopatadine HCl 0.2 % SOLN   loratadine (CLARITIN) 10 MG tablet   Need for  diphtheria-tetanus-pertussis (Tdap) vaccine       Relevant Medications   Tdap (BOOSTRIX) 5-2.5-18.5 LF-MCG/0.5 injection      Follow-up: No follow-ups on file.  Wilfred Lacy, NP

## 2020-09-21 ENCOUNTER — Other Ambulatory Visit: Payer: Self-pay | Admitting: Nurse Practitioner

## 2020-09-21 ENCOUNTER — Telehealth: Payer: Self-pay

## 2020-09-21 DIAGNOSIS — M170 Bilateral primary osteoarthritis of knee: Secondary | ICD-10-CM

## 2020-09-21 NOTE — Telephone Encounter (Signed)
[facility] sent message to pt to contact provider for refill of furosemide (LASIX) 20 MG tablet   [facility] [address] [facility] [place]  [place]

## 2020-09-21 NOTE — Telephone Encounter (Signed)
Chart supports Rx Next ov 10/08/20

## 2020-09-24 NOTE — Telephone Encounter (Signed)
Ok to refill for 90tabs, no refill 

## 2020-09-26 NOTE — Telephone Encounter (Signed)
Done

## 2020-09-26 NOTE — Telephone Encounter (Signed)
Per Alysia Penna NP  90 tabs with no refills sent in

## 2020-10-08 ENCOUNTER — Ambulatory Visit: Payer: BC Managed Care – PPO | Admitting: Nurse Practitioner

## 2020-10-19 NOTE — Telephone Encounter (Signed)
Spoke to pt told her Rx for Furosemide was sent to pharmacy but need to repeat lab work for further refills. Told pt to call back an schedule lab appt only. Pt verbalized understanding.

## 2020-10-22 ENCOUNTER — Other Ambulatory Visit: Payer: Self-pay

## 2020-10-23 ENCOUNTER — Ambulatory Visit (INDEPENDENT_AMBULATORY_CARE_PROVIDER_SITE_OTHER): Payer: BC Managed Care – PPO | Admitting: Nurse Practitioner

## 2020-10-23 ENCOUNTER — Encounter: Payer: Self-pay | Admitting: Nurse Practitioner

## 2020-10-23 ENCOUNTER — Other Ambulatory Visit: Payer: Self-pay

## 2020-10-23 VITALS — BP 114/70 | HR 76 | Temp 97.0°F | Ht 70.0 in | Wt 193.8 lb

## 2020-10-23 DIAGNOSIS — E1169 Type 2 diabetes mellitus with other specified complication: Secondary | ICD-10-CM

## 2020-10-23 DIAGNOSIS — I10 Essential (primary) hypertension: Secondary | ICD-10-CM

## 2020-10-23 DIAGNOSIS — E7800 Pure hypercholesterolemia, unspecified: Secondary | ICD-10-CM | POA: Diagnosis not present

## 2020-10-23 LAB — HEMOGLOBIN A1C: Hgb A1c MFr Bld: 7.7 % — ABNORMAL HIGH (ref 4.6–6.5)

## 2020-10-23 MED ORDER — GLIPIZIDE ER 2.5 MG PO TB24: 2.5000 mg | ORAL_TABLET | Freq: Every day | ORAL | 1 refills | Status: AC

## 2020-10-23 MED ORDER — METFORMIN HCL 500 MG PO TABS: 500.0000 mg | ORAL_TABLET | Freq: Two times a day (BID) | ORAL | 3 refills | Status: DC

## 2020-10-23 NOTE — Progress Notes (Signed)
Subjective:  Patient ID: Veronica Alvarado, female    DOB: [birth year]  Age: 70 y.o. MRN: [id number]  CC: Follow-up (6 month f/u for HTN, DM, and hyperlipidemia. /Pt is not fasting. )  HPI  Essential hypertension BP at goal BP Readings from Last 3 Encounters:  10/23/20 114/70  09/19/20 136/74  08/01/20 (!) 150/82   Continue furosemide prn Repeat BMP  DM (diabetes mellitus) ([place]) Controlled with glipizide and metformin Fasting glucose of 120 per patient Stable neuropathy with gabapentin Up to date with eye exam, has upcoming surgery for glaucoma.  Repeat hgbA1c today at 7.7% Maintain medication dose Pending BMP and lipid panel  BP Readings from Last 3 Encounters:  10/23/20 114/70  09/19/20 136/74  08/01/20 (!) 150/82   Wt Readings from Last 3 Encounters:  10/23/20 193 lb 12.8 oz (87.9 kg)  09/19/20 198 lb 3.2 oz (89.9 kg)  08/14/20 180 lb (81.6 kg)    Reviewed past Medical, Social and Family history today.  Outpatient Medications Prior to Visit  Medication Sig Dispense Refill  . ACCU-CHEK GUIDE test strip CHECK BLOOD SUGAR ONCE A DAY BEFORE BREAKFAST 100 strip 0  . Accu-Chek Softclix Lancets lancets Use once a day before breakfast E11.65 100 each 0  . aspirin EC 81 MG tablet Take by mouth.    . blood glucose meter kit and supplies KIT Dispense based on patient and insurance preference. Check glucose once a day before breakfast. ICD-10: E11.65 1 each 0  . cholecalciferol (VITAMIN D3) 25 MCG (1000 UT) tablet Take 1 tablet (1,000 Units total) by mouth 2 (two) times daily.    . dorzolamide (TRUSOPT) 2 % ophthalmic solution INSTILL 1 DROP INTO BOTH EYES TWICE A DAY    . furosemide (LASIX) 20 MG tablet TAKE 1 TABLET BY MOUTH EVERY DAY AS NEEDED 90 tablet 0  . gabapentin (NEURONTIN) 300 MG capsule TAKE 1 CAPSULE BY MOUTH IN AM AND NOON, AND 2 CAPS AT BEDTIME 120 capsule 5  . loratadine (CLARITIN) 10 MG tablet Take 1 tablet (10 mg total) by mouth daily. 30 tablet 0  .  meloxicam (MOBIC) 7.5 MG tablet TAKE 1 TABLET (7.5 MG TOTAL) BY MOUTH DAILY. WITH FOOD 90 tablet 1  . Multiple Vitamin (MULTI-VITAMIN) tablet Take by mouth.    . Netarsudil-Latanoprost 0.02-0.005 % SOLN Place 1 drop into both eyes nightly.    Marland Kitchen Olopatadine HCl 0.2 % SOLN Apply 1 drop to eye daily. 2.5 mL 0  . glipiZIDE (GLUCOTROL XL) 2.5 MG 24 hr tablet TAKE 1 TABLET (2.5 MG TOTAL) BY MOUTH DAILY WITH BREAKFAST. WITH HEAVIEST MEAL 270 tablet 1  . metFORMIN (GLUCOPHAGE) 500 MG tablet Take 1 tablet (500 mg total) by mouth 2 (two) times daily with a meal. 180 tablet 3   No facility-administered medications prior to visit.    ROS See HPI  Objective:  BP 114/70 (BP Location: Left Arm, Patient Position: Sitting, Cuff Size: Normal)   Pulse 76   Temp (!) 97 F (36.1 C) (Temporal)   Ht _0  (1.778 m)   Wt 193 lb 12.8 oz (87.9 kg)   SpO2 99%   BMI 27.81 kg/m   Physical Exam  Assessment & Plan:  This visit occurred during the SARS-CoV-2 public health emergency.  Safety protocols were in place, including screening questions prior to the visit, additional usage of staff PPE, and extensive cleaning of exam room while observing appropriate contact time as indicated for disinfecting solutions.   Veronica Alvarado was seen today for  follow-up.  Diagnoses and all orders for this visit:  Essential hypertension -     Basic metabolic panel  Type 2 diabetes mellitus with other specified complication, without long-term current use of insulin (HCC) -     Hemoglobin A1c -     Basic metabolic panel -     glipiZIDE (GLUCOTROL XL) 2.5 MG 24 hr tablet; Take 1 tablet (2.5 mg total) by mouth daily with breakfast. With heaviest meal  Hypercholesterolemia -     Hepatic function panel -     Lipid panel  Diabetic polyneuropathy associated with type 2 diabetes mellitus (HCC) -     metFORMIN (GLUCOPHAGE) 500 MG tablet; Take 1 tablet (500 mg total) by mouth 2 (two) times daily with a meal.  Controlled type 2  diabetes mellitus with diabetic polyneuropathy, without long-term current use of insulin (HCC) -     metFORMIN (GLUCOPHAGE) 500 MG tablet; Take 1 tablet (500 mg total) by mouth 2 (two) times daily with a meal.    Problem List Items Addressed This Visit      Cardiovascular and Mediastinum   Essential hypertension - Primary    BP at goal BP Readings from Last 3 Encounters:  10/23/20 114/70  09/19/20 136/74  08/01/20 (!) 150/82   Continue furosemide prn Repeat BMP      Relevant Orders   Basic metabolic panel     Endocrine   DM (diabetes mellitus) ([place])    Controlled with glipizide and metformin Fasting glucose of 120 per patient Stable neuropathy with gabapentin Up to date with eye exam, has upcoming surgery for glaucoma.  Repeat hgbA1c today at 7.7% Maintain medication dose Pending BMP and lipid panel      Relevant Medications   glipiZIDE (GLUCOTROL XL) 2.5 MG 24 hr tablet   metFORMIN (GLUCOPHAGE) 500 MG tablet   Other Relevant Orders   Hemoglobin A1c (Completed)   Basic metabolic panel   Polyneuropathy in diabetes (HCC)   Relevant Medications   glipiZIDE (GLUCOTROL XL) 2.5 MG 24 hr tablet   metFORMIN (GLUCOPHAGE) 500 MG tablet     Other   Hypercholesterolemia   Relevant Orders   Hepatic function panel   Lipid panel    Other Visit Diagnoses    Controlled type 2 diabetes mellitus with diabetic polyneuropathy, without long-term current use of insulin (HCC)       Relevant Medications   glipiZIDE (GLUCOTROL XL) 2.5 MG 24 hr tablet   metFORMIN (GLUCOPHAGE) 500 MG tablet      Follow-up: Return in about 6 months (around 04/25/2021) for DM and HTN, hyperlipidemia (fasting).  Wilfred Lacy, NP

## 2020-10-23 NOTE — Assessment & Plan Note (Addendum)
Controlled with glipizide and metformin Fasting glucose of 120 per patient Stable neuropathy with gabapentin Up to date with eye exam, has upcoming surgery for glaucoma.  Repeat hgbA1c today at 7.7% Maintain medication dose Pending BMP and lipid panel

## 2020-10-23 NOTE — Patient Instructions (Addendum)
You are due for repeat mammogram 03/2020. Call the breast center and schedule your appt.  You are due for colonoscopy 07/2021.  Sign medical release to get eye exam report from ophthalmology.  Go to lab for blood draw.

## 2020-10-23 NOTE — Assessment & Plan Note (Signed)
BP at goal BP Readings from Last 3 Encounters:  10/23/20 114/70  09/19/20 136/74  08/01/20 (!) 150/82   Continue furosemide prn Repeat BMP

## 2020-10-24 LAB — LIPID PANEL
Cholesterol: 184 mg/dL (ref 0–200)
HDL: 50.3 mg/dL (ref >39.00)
LDL Cholesterol: 100 mg/dL — ABNORMAL HIGH (ref 0–99)
NonHDL: 133.37
Total CHOL/HDL Ratio: 4
Triglycerides: 166 mg/dL — ABNORMAL HIGH (ref 0.0–149.0)
VLDL: 33.2 mg/dL (ref 0.0–40.0)

## 2020-10-24 LAB — BASIC METABOLIC PANEL
BUN: 16 mg/dL (ref 6–23)
CO2: 32 mEq/L (ref 19–32)
Calcium: 9.9 mg/dL (ref 8.4–10.5)
Chloride: 104 mEq/L (ref 96–112)
Creatinine, Ser: 0.86 mg/dL (ref 0.40–1.20)
GFR: 68.39 mL/min (ref >60.00)
Glucose, Bld: 143 mg/dL — ABNORMAL HIGH (ref 70–99)
Potassium: 4.1 mEq/L (ref 3.5–5.1)
Sodium: 143 mEq/L (ref 135–145)

## 2020-10-24 LAB — HEPATIC FUNCTION PANEL
ALT: 16 U/L (ref 0–35)
AST: 23 U/L (ref 0–37)
Albumin: 4.2 g/dL (ref 3.5–5.2)
Alkaline Phosphatase: 48 U/L (ref 39–117)
Bilirubin, Direct: 0.1 mg/dL (ref 0.0–0.3)
Total Bilirubin: 0.3 mg/dL (ref 0.2–1.2)
Total Protein: 7.1 g/dL (ref 6.0–8.3)

## 2020-10-31 ENCOUNTER — Other Ambulatory Visit: Payer: Self-pay | Admitting: Nurse Practitioner

## 2020-11-30 ENCOUNTER — Other Ambulatory Visit: Payer: Self-pay

## 2020-12-04 ENCOUNTER — Other Ambulatory Visit: Payer: Self-pay | Admitting: Nurse Practitioner

## 2020-12-04 DIAGNOSIS — E1169 Type 2 diabetes mellitus with other specified complication: Secondary | ICD-10-CM

## 2020-12-04 NOTE — Telephone Encounter (Signed)
Medication failed to go through electronically on 11/30/20, spoke with pharmacy and due to insurance they could not take a verbal and state they will send the request again.

## 2020-12-04 NOTE — Telephone Encounter (Signed)
Chart supports Rx 

## 2020-12-22 ENCOUNTER — Other Ambulatory Visit: Payer: Self-pay | Admitting: Nurse Practitioner

## 2020-12-22 DIAGNOSIS — I10 Essential (primary) hypertension: Secondary | ICD-10-CM

## 2021-01-31 DIAGNOSIS — H2513 Age-related nuclear cataract, bilateral: Secondary | ICD-10-CM | POA: Insufficient documentation

## 2021-02-22 ENCOUNTER — Telehealth: Payer: Self-pay | Admitting: Nurse Practitioner

## 2021-02-22 DIAGNOSIS — Z1211 Encounter for screening for malignant neoplasm of colon: Secondary | ICD-10-CM

## 2021-02-22 NOTE — Telephone Encounter (Signed)
Pt got letter from insurance stating she needs a colonoscopy, I told her Veronica Alvarado would probably need to see her before referring. She has an appointment 11/30.

## 2021-02-27 NOTE — Addendum Note (Signed)
Addended by: Willaim Bane on: 02/27/2021 04:02 PM   Modules accepted: Orders

## 2021-03-22 ENCOUNTER — Other Ambulatory Visit: Payer: Self-pay | Admitting: Nurse Practitioner

## 2021-03-22 DIAGNOSIS — M170 Bilateral primary osteoarthritis of knee: Secondary | ICD-10-CM

## 2021-03-25 ENCOUNTER — Other Ambulatory Visit: Payer: Self-pay | Admitting: Nurse Practitioner

## 2021-03-25 DIAGNOSIS — I10 Essential (primary) hypertension: Secondary | ICD-10-CM

## 2021-03-27 ENCOUNTER — Encounter: Payer: Self-pay | Admitting: Nurse Practitioner

## 2021-03-27 DIAGNOSIS — T466X5A Adverse effect of antihyperlipidemic and antiarteriosclerotic drugs, initial encounter: Secondary | ICD-10-CM | POA: Insufficient documentation

## 2021-04-01 LAB — HM MAMMOGRAPHY

## 2021-04-04 ENCOUNTER — Telehealth: Payer: Self-pay | Admitting: Nurse Practitioner

## 2021-04-04 NOTE — Telephone Encounter (Signed)
Pt called requesting refill on her fluid pills.Marland KitchenMarland Kitchen

## 2021-04-08 NOTE — Telephone Encounter (Signed)
Pt called for refill on furosemide (LASIX) 20 MG tablet She states she has been out for almost a week. Takes 1/day in morning.  Pharmacy advised pt they have requested 2-3x. Pt called 11/3 as well.  Pharmacy: [id number] - [place], [place] - [address] AT [facility] [address] Phone:  [phone]  Fax:  [phone]     Next OV 05/01/2021  Phone# [phone]

## 2021-04-09 NOTE — Telephone Encounter (Signed)
Verbal order given to pharmacy after confirming pt has not picked up Rx

## 2021-04-12 ENCOUNTER — Telehealth: Payer: Self-pay | Admitting: Nurse Practitioner

## 2021-04-12 NOTE — Telephone Encounter (Signed)
error 

## 2021-04-29 ENCOUNTER — Ambulatory Visit: Payer: BC Managed Care – PPO | Admitting: Nurse Practitioner

## 2021-05-01 ENCOUNTER — Encounter: Payer: Self-pay | Admitting: Nurse Practitioner

## 2021-05-01 ENCOUNTER — Ambulatory Visit (INDEPENDENT_AMBULATORY_CARE_PROVIDER_SITE_OTHER): Payer: BC Managed Care – PPO | Admitting: Nurse Practitioner

## 2021-05-01 ENCOUNTER — Other Ambulatory Visit: Payer: Self-pay

## 2021-05-01 VITALS — BP 130/82 | HR 78 | Temp 96.2°F | Ht 70.0 in | Wt 190.0 lb

## 2021-05-01 DIAGNOSIS — E785 Hyperlipidemia, unspecified: Secondary | ICD-10-CM

## 2021-05-01 DIAGNOSIS — I10 Essential (primary) hypertension: Secondary | ICD-10-CM | POA: Diagnosis not present

## 2021-05-01 DIAGNOSIS — E1169 Type 2 diabetes mellitus with other specified complication: Secondary | ICD-10-CM | POA: Diagnosis not present

## 2021-05-01 DIAGNOSIS — D649 Anemia, unspecified: Secondary | ICD-10-CM | POA: Diagnosis not present

## 2021-05-01 LAB — POCT GLYCOSYLATED HEMOGLOBIN (HGB A1C): Hemoglobin A1C: 7 % — AB (ref 4.0–5.6)

## 2021-05-01 NOTE — Assessment & Plan Note (Signed)
Fasting glucose:80-140. Symptomatic hypoglycemia, had 3 episode since last OV, due to skipped meals. Up to date with eye exam and foot exam. Podiatry: Dr. Romualdo Bolk with [facility]  Today's POCT hgbA1c at 7.0%: controlled DM Continue metformin and glipizide at current dose F/up in 19months

## 2021-05-01 NOTE — Progress Notes (Signed)
Subjective:  Patient ID: Veronica Alvarado, female    DOB: 05/31/1951  Age: 70 y.o. MRN: [id number]  CC: Follow-up (6 month f/u on DM, HTN, and cholesterol. Pt states she checks blood sugars at home and states they range 80s-140s. Pt is not fasting. )  HPI  Hypercholesterolemia Reports diet modification: low fat and low carb, daily exercise (walking). Hx of statin myopathy Repeat lipid panel   Essential hypertension BP at goal with furosemide prn Improved LE edema with DASh diet, exercise and use of compression stocking BP Readings from Last 3 Encounters:  05/01/21 130/82  10/23/20 114/70  09/19/20 136/74   Repeat BMP Maintain medication dose  DM (diabetes mellitus) ([place]) Fasting glucose:80-140. Symptomatic hypoglycemia, had 3 episode since last OV, due to skipped meals. Up to date with eye exam and foot exam. Podiatry: Dr. Delora Fuel with [facility] POCT hgbA1c at 7.0%: controlled DM Continue metformin and glipizide at current dose F/up in 71month  Wt Readings from Last 3 Encounters:  05/01/21 190 lb (86.2 kg)  10/23/20 193 lb 12.8 oz (87.9 kg)  09/19/20 198 lb 3.2 oz (89.9 kg)    Reviewed past Medical, Social and Family history today.  Outpatient Medications Prior to Visit  Medication Sig Dispense Refill   ACCU-CHEK GUIDE test strip CHECK BLOOD SUGAR ONCE A DAY BEFORE BREAKFAST 100 strip 0   Accu-Chek Softclix Lancets lancets USE ONCE A DAY BEFORE BREAKFAST E11.65 100 each 0   aspirin EC 81 MG tablet Take by mouth.     blood glucose meter kit and supplies KIT Dispense based on patient and insurance preference. Check glucose once a day before breakfast. ICD-10: E11.65 1 each 0   cholecalciferol (VITAMIN D3) 25 MCG (1000 UT) tablet Take 1 tablet (1,000 Units total) by mouth 2 (two) times daily.     dorzolamide (TRUSOPT) 2 % ophthalmic solution INSTILL 1 DROP INTO BOTH EYES TWICE A DAY     furosemide (LASIX) 20 MG tablet TAKE 1 TABLET BY MOUTH EVERY DAY AS  NEEDED 90 tablet 0   gabapentin (NEURONTIN) 300 MG capsule TAKE 1 CAPSULE BY MOUTH IN AM AND NOON, AND 2 CAPS AT BEDTIME 120 capsule 5   glipiZIDE (GLUCOTROL XL) 2.5 MG 24 hr tablet Take 1 tablet (2.5 mg total) by mouth daily with breakfast. With heaviest meal 270 tablet 1   loratadine (CLARITIN) 10 MG tablet Take 1 tablet (10 mg total) by mouth daily. 30 tablet 0   meloxicam (MOBIC) 7.5 MG tablet TAKE 1 TABLET (7.5 MG TOTAL) BY MOUTH DAILY. WITH FOOD 90 tablet 1   metFORMIN (GLUCOPHAGE) 500 MG tablet Take 1 tablet (500 mg total) by mouth 2 (two) times daily with a meal. 180 tablet 3   Multiple Vitamin (MULTI-VITAMIN) tablet Take by mouth.     Netarsudil-Latanoprost 0.02-0.005 % SOLN Place 1 drop into both eyes nightly.     Olopatadine HCl 0.2 % SOLN Apply 1 drop to eye daily. 2.5 mL 0   No facility-administered medications prior to visit.   ROS See HPI  Objective:  BP 130/82 (BP Location: Right Arm, Patient Position: Sitting, Cuff Size: Large)   Pulse 78   Temp (!) 96.2 F (35.7 C) (Temporal)   Ht 5' 10"  (1.778 m)   Wt 190 lb (86.2 kg)   SpO2 99%   BMI 27.26 kg/m   Physical Exam Cardiovascular:     Rate and Rhythm: Normal rate and regular rhythm.     Pulses: Normal pulses.  Heart sounds: Normal heart sounds.  Pulmonary:     Effort: Pulmonary effort is normal.     Breath sounds: Normal breath sounds.  Musculoskeletal:     Right lower leg: No edema.     Left lower leg: No edema.  Neurological:     Mental Status: She is alert and oriented to person, place, and time.    Assessment & Plan:  This visit occurred during the SARS-CoV-2 public health emergency.  Safety protocols were in place, including screening questions prior to the visit, additional usage of staff PPE, and extensive cleaning of exam room while observing appropriate contact time as indicated for disinfecting solutions.   Veronica Alvarado was seen today for follow-up.  Diagnoses and all orders for this  visit:  Essential hypertension -     Basic metabolic panel; Future  Type 2 diabetes mellitus with other specified complication, without long-term current use of insulin (HCC) -     Basic metabolic panel; Future -     Microalbumin / creatinine urine ratio; Future -     POCT glycosylated hemoglobin (Hb A1C)  Hyperlipidemia associated with type 2 diabetes mellitus (HCC) -     Lipid panel; Future -     Hepatic function panel; Future  Anemia, unspecified type -     CBC with Differential/Platelet; Future -     B12; Future   Problem List Items Addressed This Visit       Cardiovascular and Mediastinum   Essential hypertension - Primary    BP at goal with furosemide prn Improved LE edema with DASh diet, exercise and use of compression stocking BP Readings from Last 3 Encounters:  05/01/21 130/82  10/23/20 114/70  09/19/20 136/74   Repeat BMP Maintain medication dose      Relevant Orders   Basic metabolic panel     Endocrine   DM (diabetes mellitus) ([place])    Fasting glucose:80-140. Symptomatic hypoglycemia, had 3 episode since last OV, due to skipped meals. Up to date with eye exam and foot exam. Podiatry: Dr. Delora Fuel with [facility] POCT hgbA1c at 7.0%: controlled DM Continue metformin and glipizide at current dose F/up in 36month      Relevant Orders   Basic metabolic panel   Microalbumin / creatinine urine ratio   POCT glycosylated hemoglobin (Hb A1C) (Completed)     Other   Anemia   Relevant Orders   CBC with Differential/Platelet   B12   Hypercholesterolemia    Reports diet modification: low fat and low carb, daily exercise (walking). Hx of statin myopathy Repeat lipid panel        Follow-up: Return in about 6 months (around 10/29/2021) for CPE (fasting).  CWilfred Lacy NP

## 2021-05-01 NOTE — Patient Instructions (Addendum)
hgbA1c at 7.0% today. Maintain current medication doses at this time.  Ok to get 2nd COVID vaccine booster.  Schedule lab a ppt for fasting blood draw. Need to be fasting 8hrs prior to blood draw. Ok to drink water.

## 2021-05-01 NOTE — Assessment & Plan Note (Signed)
Reports diet modification: low fat and low carb, daily exercise (walking). Hx of statin myopathy Repeat lipid panel

## 2021-05-01 NOTE — Assessment & Plan Note (Addendum)
BP at goal with furosemide prn Improved LE edema with DASh diet, exercise and use of compression stocking BP Readings from Last 3 Encounters:  05/01/21 130/82  10/23/20 114/70  09/19/20 136/74   Repeat BMP Maintain medication dose

## 2021-05-02 ENCOUNTER — Other Ambulatory Visit (INDEPENDENT_AMBULATORY_CARE_PROVIDER_SITE_OTHER): Payer: BC Managed Care – PPO

## 2021-05-02 DIAGNOSIS — D649 Anemia, unspecified: Secondary | ICD-10-CM | POA: Diagnosis not present

## 2021-05-02 DIAGNOSIS — E785 Hyperlipidemia, unspecified: Secondary | ICD-10-CM

## 2021-05-02 DIAGNOSIS — I10 Essential (primary) hypertension: Secondary | ICD-10-CM | POA: Diagnosis not present

## 2021-05-02 DIAGNOSIS — E1169 Type 2 diabetes mellitus with other specified complication: Secondary | ICD-10-CM | POA: Diagnosis not present

## 2021-05-02 LAB — MICROALBUMIN / CREATININE URINE RATIO
Creatinine,U: 168.4 mg/dL
Microalb Creat Ratio: 0.5 mg/g (ref 0.0–30.0)
Microalb, Ur: 0.9 mg/dL (ref 0.0–1.9)

## 2021-05-02 LAB — LIPID PANEL
Cholesterol: 192 mg/dL (ref 0–200)
HDL: 63.6 mg/dL (ref >39.00)
LDL Cholesterol: 105 mg/dL — ABNORMAL HIGH (ref 0–99)
NonHDL: 128.51
Total CHOL/HDL Ratio: 3
Triglycerides: 120 mg/dL (ref 0.0–149.0)
VLDL: 24 mg/dL (ref 0.0–40.0)

## 2021-05-02 LAB — HEPATIC FUNCTION PANEL
ALT: 22 U/L (ref 0–35)
AST: 35 U/L (ref 0–37)
Albumin: 4.5 g/dL (ref 3.5–5.2)
Alkaline Phosphatase: 42 U/L (ref 39–117)
Bilirubin, Direct: 0.1 mg/dL (ref 0.0–0.3)
Total Bilirubin: 0.6 mg/dL (ref 0.2–1.2)
Total Protein: 8 g/dL (ref 6.0–8.3)

## 2021-05-02 LAB — CBC WITH DIFFERENTIAL/PLATELET
Basophils Absolute: 0 10*3/uL (ref 0.0–0.1)
Basophils Relative: 0.8 % (ref 0.0–3.0)
Eosinophils Absolute: 0.1 10*3/uL (ref 0.0–0.7)
Eosinophils Relative: 3.2 % (ref 0.0–5.0)
HCT: 37.5 % (ref 36.0–46.0)
Hemoglobin: 12.2 g/dL (ref 12.0–15.0)
Lymphocytes Relative: 35.8 % (ref 12.0–46.0)
Lymphs Abs: 1.2 10*3/uL (ref 0.7–4.0)
MCHC: 32.5 g/dL (ref 30.0–36.0)
MCV: 83.6 fl (ref 78.0–100.0)
Monocytes Absolute: 0.3 10*3/uL (ref 0.1–1.0)
Monocytes Relative: 8.6 % (ref 3.0–12.0)
Neutro Abs: 1.8 10*3/uL (ref 1.4–7.7)
Neutrophils Relative %: 51.6 % (ref 43.0–77.0)
Platelets: 230 10*3/uL (ref 150.0–400.0)
RBC: 4.49 Mil/uL (ref 3.87–5.11)
RDW: 14.7 % (ref 11.5–15.5)
WBC: 3.4 10*3/uL — ABNORMAL LOW (ref 4.0–10.5)

## 2021-05-02 LAB — BASIC METABOLIC PANEL
BUN: 15 mg/dL (ref 6–23)
CO2: 30 mEq/L (ref 19–32)
Calcium: 10.3 mg/dL (ref 8.4–10.5)
Chloride: 101 mEq/L (ref 96–112)
Creatinine, Ser: 1.05 mg/dL (ref 0.40–1.20)
GFR: 53.62 mL/min — ABNORMAL LOW (ref >60.00)
Glucose, Bld: 112 mg/dL — ABNORMAL HIGH (ref 70–99)
Potassium: 4.1 mEq/L (ref 3.5–5.1)
Sodium: 140 mEq/L (ref 135–145)

## 2021-05-02 LAB — VITAMIN B12: Vitamin B-12: 608 pg/mL (ref 211–911)

## 2021-05-09 LAB — VITAMIN D 25 HYDROXY (VIT D DEFICIENCY, FRACTURES): Vit D, 25-Hydroxy: 29

## 2021-05-09 NOTE — Addendum Note (Signed)
Addended by: Alysia Penna L on: 05/09/2021 01:22 PM   Modules accepted: Orders

## 2021-05-10 ENCOUNTER — Telehealth: Payer: Self-pay | Admitting: Nurse Practitioner

## 2021-05-10 NOTE — Telephone Encounter (Signed)
Pt called back for lab results

## 2021-05-10 NOTE — Telephone Encounter (Signed)
Called and inform patient of lab results. Pt voiced understanding.

## 2021-05-20 ENCOUNTER — Ambulatory Visit (INDEPENDENT_AMBULATORY_CARE_PROVIDER_SITE_OTHER): Payer: BC Managed Care – PPO | Admitting: Family Medicine

## 2021-05-20 ENCOUNTER — Ambulatory Visit: Payer: Self-pay

## 2021-05-20 VITALS — BP 120/70 | Ht 70.0 in | Wt 190.0 lb

## 2021-05-20 DIAGNOSIS — M170 Bilateral primary osteoarthritis of knee: Secondary | ICD-10-CM | POA: Diagnosis not present

## 2021-05-20 MED ORDER — DICLOFENAC SODIUM 1 % EX GEL: 4.0000 g | Freq: Four times a day (QID) | CUTANEOUS | 11 refills | Status: AC

## 2021-05-20 NOTE — Assessment & Plan Note (Signed)
Acute on chronic in nature.  Effusion appreciated with history of degenerative changes. -Counseled on home exercise therapy and supportive care. -Bilateral Zilretta injection. -Could consider physical therapy.

## 2021-05-20 NOTE — Patient Instructions (Addendum)
Good to see you  Please try ice  Please try the rub on medicine  Please send me a message in MyChart with any questions or updates.  Please see me back in 4-6 weeks.   --Dr. Jordan Likes

## 2021-05-20 NOTE — Progress Notes (Signed)
Veronica Alvarado - 70 y.o. female MRN [id number]  Date of birth: 11-Dec-1950  SUBJECTIVE:  Including CC & ROS.  No chief complaint on file.   Veronica Alvarado is a 70 y.o. female that is presenting with acute on chronic bilateral knee pain.  Pain is similar to the pain that she is experienced in the past.  Denies any injury inciting event.   Review of Systems See HPI   HISTORY: Past Medical, Surgical, Social, and Family History Reviewed & Updated per EMR.   Pertinent Historical Findings include:  Past Medical History:  Diagnosis Date   Arthritis    Diabetes mellitus without complication (HCC)     Past Surgical History:  Procedure Laterality Date   FOOT SURGERY Right 11/2018   TOTAL HIP ARTHROPLASTY Bilateral 2013   WRIST SURGERY Right     Family History  Problem Relation Age of Onset   Sudden death Mother    Cataracts Father    Diabetes Father    Cancer Brother        prostate cancer    Social History   Socioeconomic History   Marital status: Divorced    Spouse name: Not on file   Number of children: Not on file   Years of education: Not on file   Highest education level: Not on file  Occupational History   Not on file  Tobacco Use   Smoking status: Never   Smokeless tobacco: Never  Vaping Use   Vaping Use: Never used  Substance and Sexual Activity   Alcohol use: Never   Drug use: Never   Sexual activity: Not on file  Other Topics Concern   Not on file  Social History Narrative   Not on file   Social Determinants of Health   Financial Resource Strain: Low Risk    Difficulty of Paying Living Expenses: Not hard at all  Food Insecurity: No Food Insecurity   Worried About Programme researcher, broadcasting/film/video in the Last Year: Never true   Ran Out of Food in the Last Year: Never true  Transportation Needs: No Transportation Needs   Lack of Transportation (Medical): No   Lack of Transportation (Non-Medical): No  Physical Activity: Inactive   Days of Exercise per Week:  0 days   Minutes of Exercise per Session: 0 min  Stress: No Stress Concern Present   Feeling of Stress : Not at all  Social Connections: Moderately Isolated   Frequency of Communication with Friends and Family: More than three times a week   Frequency of Social Gatherings with Friends and Family: More than three times a week   Attends Religious Services: 1 to 4 times per year   Active Member of [employer] or Organizations: No   Attends Engineer, structural: Never   Marital Status: Divorced  Catering manager Violence: Not At Risk   Fear of Current or Ex-Partner: No   Emotionally Abused: No   Physically Abused: No   Sexually Abused: No     PHYSICAL EXAM:  VS: BP 120/70    Ht 5\' 10"  (1.778 m)    Wt 190 lb (86.2 kg)    BMI 27.26 kg/m  Physical Exam Gen: NAD, alert, cooperative with exam, well-appearing     Aspiration/Injection Procedure Note Duaa Stelzner [birth year]  Procedure: Aspiration and Injection Indications: left knee pain   Procedure Details Consent: Risks of procedure as well as the alternatives and risks of each were explained to the (patient/caregiver).  Consent for procedure obtained. Time  Out: Verified patient identification, verified procedure, site/side was marked, verified correct patient position, special equipment/implants available, medications/allergies/relevent history reviewed, required imaging and test results available.  Performed.  The area was cleaned with iodine and alcohol swabs.    The left knee superior lateral suprapatellar pouch was injected using 3 cc of 1% lidocaine on a 25-gauge 1-1/2 inch needle.  An 18-gauge 1-1/2 needle was used to achieve aspiration.  The syringe was switched and a mixture containing 5 cc's of 32 mg Zilretta and 4 cc's of 0.25% bupivacaine was injected.  Ultrasound was used. Images were obtained in long views showing the injection.   Amount of Fluid Aspirated: 10mL Character of Fluid: clear and straw colored Fluid was  sent for: n/a  A sterile dressing was applied.  Patient did tolerate procedure well.   Aspiration/Injection Procedure Note Venisha Boehning [birth year]  Procedure: Aspiration and Injection Indications: right knee pain   Procedure Details Consent: Risks of procedure as well as the alternatives and risks of each were explained to the (patient/caregiver).  Consent for procedure obtained. Time Out: Verified patient identification, verified procedure, site/side was marked, verified correct patient position, special equipment/implants available, medications/allergies/relevent history reviewed, required imaging and test results available.  Performed.  The area was cleaned with iodine and alcohol swabs.    The right knee superior lateral suprapatellar pouch was injected using 3 cc of 1% lidocaine on a 25-gauge 1-1/2 inch needle.  An 18-gauge 1-1/2 needle was used to achieve aspiration.  The syringe was switched and a mixture containing 5 cc's of 32 mg Zilretta and 4 cc's of 0.25% bupivacaine was injected.  Ultrasound was used. Images were obtained in long views showing the injection.   Amount of Fluid Aspirated: 90mL Character of Fluid: clear and straw colored Fluid was sent for: n/a  A sterile dressing was applied.  Patient did tolerate procedure well.    ASSESSMENT & PLAN:   OA (osteoarthritis) of knee Acute on chronic in nature.  Effusion appreciated with history of degenerative changes. -Counseled on home exercise therapy and supportive care. -Bilateral Zilretta injection. -Could consider physical therapy.

## 2021-05-23 ENCOUNTER — Telehealth: Payer: Self-pay | Admitting: Nurse Practitioner

## 2021-05-23 DIAGNOSIS — E559 Vitamin D deficiency, unspecified: Secondary | ICD-10-CM

## 2021-05-23 MED ORDER — VITAMIN D (ERGOCALCIFEROL) 1.25 MG (50000 UNIT) PO CAPS: 50000.0000 [IU] | ORAL_CAPSULE | ORAL | 0 refills | Status: AC

## 2021-05-23 NOTE — Telephone Encounter (Signed)
Vit. D of 29 per podiatrist. Sent high dose of vit. D. Take x 8weeks then switch to 2000IU daily from OTC

## 2021-05-23 NOTE — Telephone Encounter (Signed)
Pt notified and verbalized understanding stating she picked up medication today.

## 2021-08-17 ENCOUNTER — Other Ambulatory Visit: Payer: Self-pay | Admitting: Nurse Practitioner

## 2021-08-17 DIAGNOSIS — E559 Vitamin D deficiency, unspecified: Secondary | ICD-10-CM

## 2021-08-19 ENCOUNTER — Telehealth: Payer: Self-pay | Admitting: Nurse Practitioner

## 2021-08-19 NOTE — Telephone Encounter (Signed)
I called patient to confirm [facility] appointment for tomorrow.  Patient said she has switched to a new PCP and cancelled appointment.  Please remove PCP. ?

## 2021-08-20 ENCOUNTER — Ambulatory Visit: Payer: BC Managed Care – PPO

## 2021-10-30 ENCOUNTER — Other Ambulatory Visit: Payer: Self-pay | Admitting: Nurse Practitioner

## 2021-10-30 DIAGNOSIS — E1169 Type 2 diabetes mellitus with other specified complication: Secondary | ICD-10-CM

## 2021-10-30 NOTE — Telephone Encounter (Signed)
Last OV: 04/2021 Needs appt for further refills.

## 2021-11-13 ENCOUNTER — Other Ambulatory Visit: Payer: Self-pay | Admitting: Nurse Practitioner

## 2021-11-13 DIAGNOSIS — E559 Vitamin D deficiency, unspecified: Secondary | ICD-10-CM

## 2022-04-09 ENCOUNTER — Telehealth: Payer: Self-pay

## 2022-04-09 NOTE — Telephone Encounter (Signed)
Pt is no longer under the care of [facility] [place] [facility]. Pt has a new PCP

## 2022-09-18 ENCOUNTER — Encounter: Payer: Self-pay | Admitting: *Deleted

## 2023-01-14 IMAGING — DX DG CERVICAL SPINE COMPLETE 4+V
5 series · 5 of 5 positions shown · non-contrast
Comparison: CT 07/29/2019

CLINICAL DATA: right arm radiculopathy

EXAM:
CERVICAL SPINE - COMPLETE 4+ VIEW

[cervical spine oblique (1 of 2)]
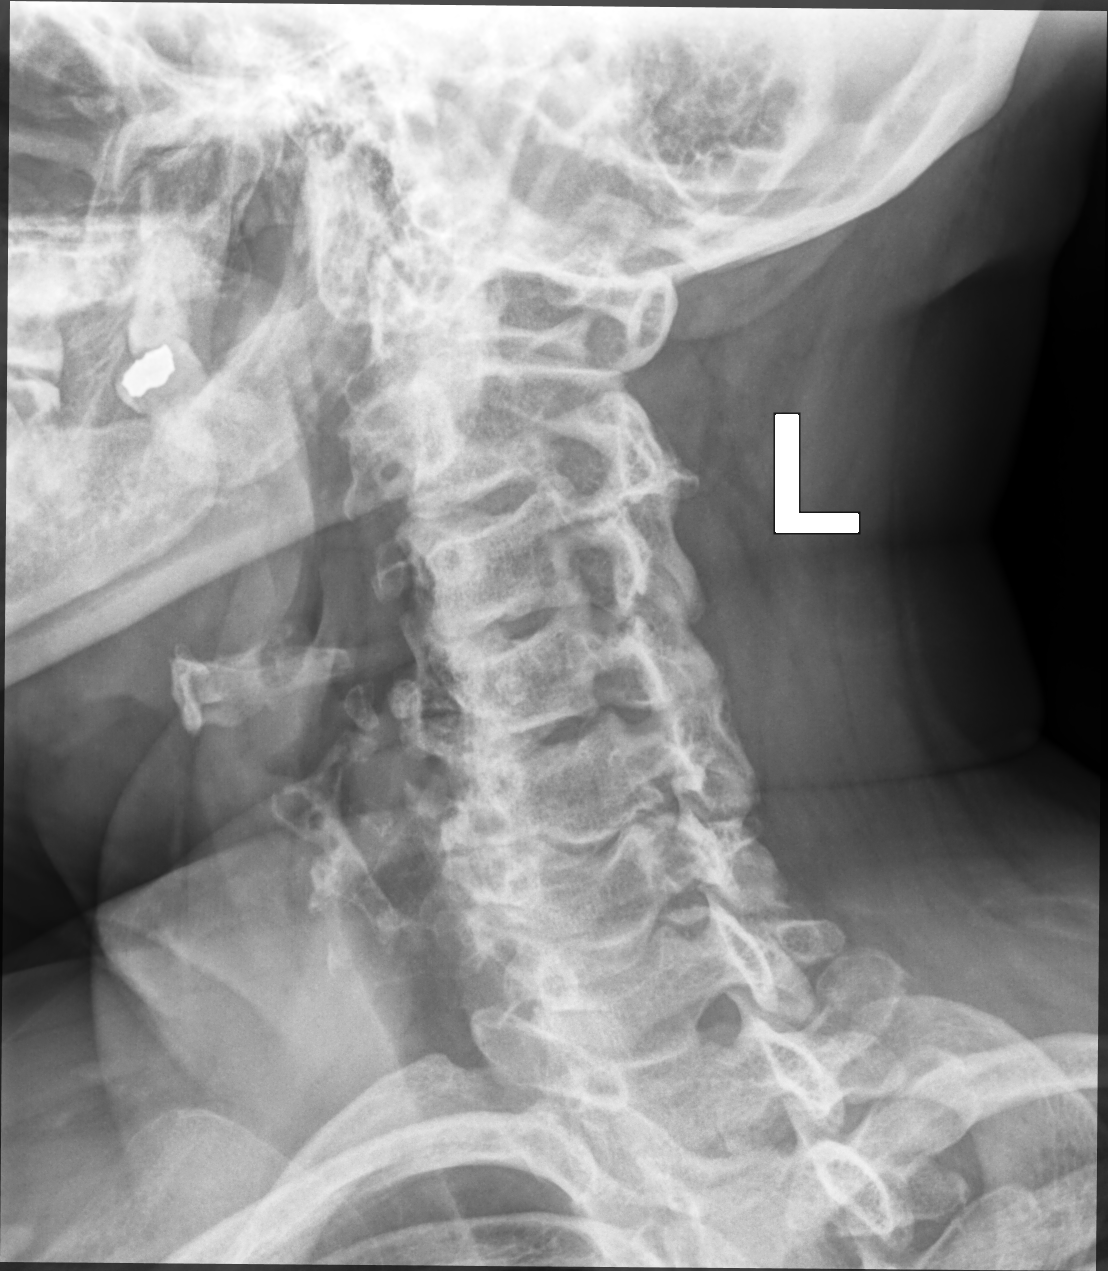

[cervical spine oblique (2 of 2)]
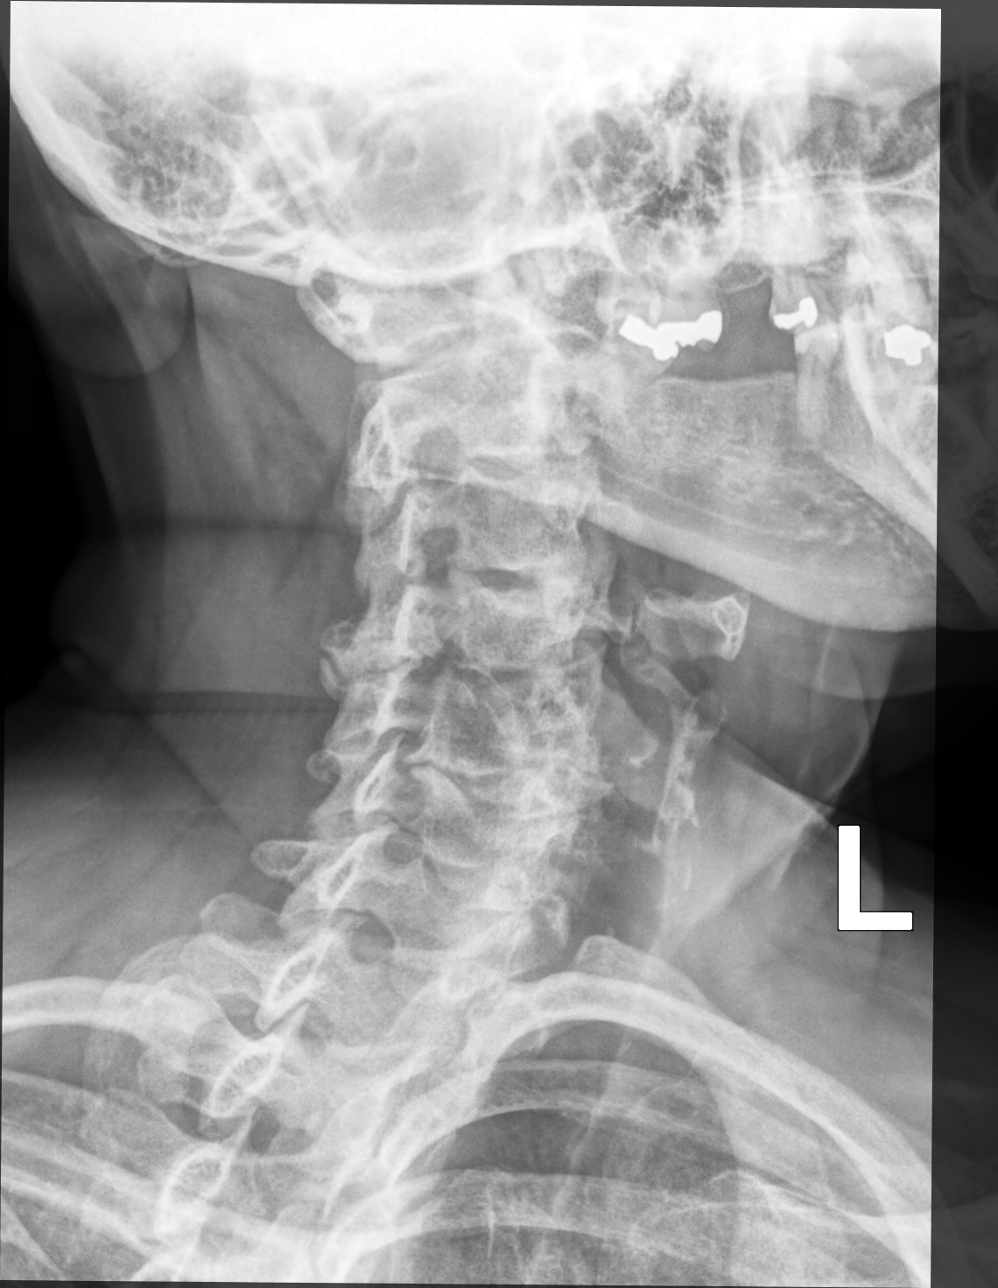

[cervical spine lat]
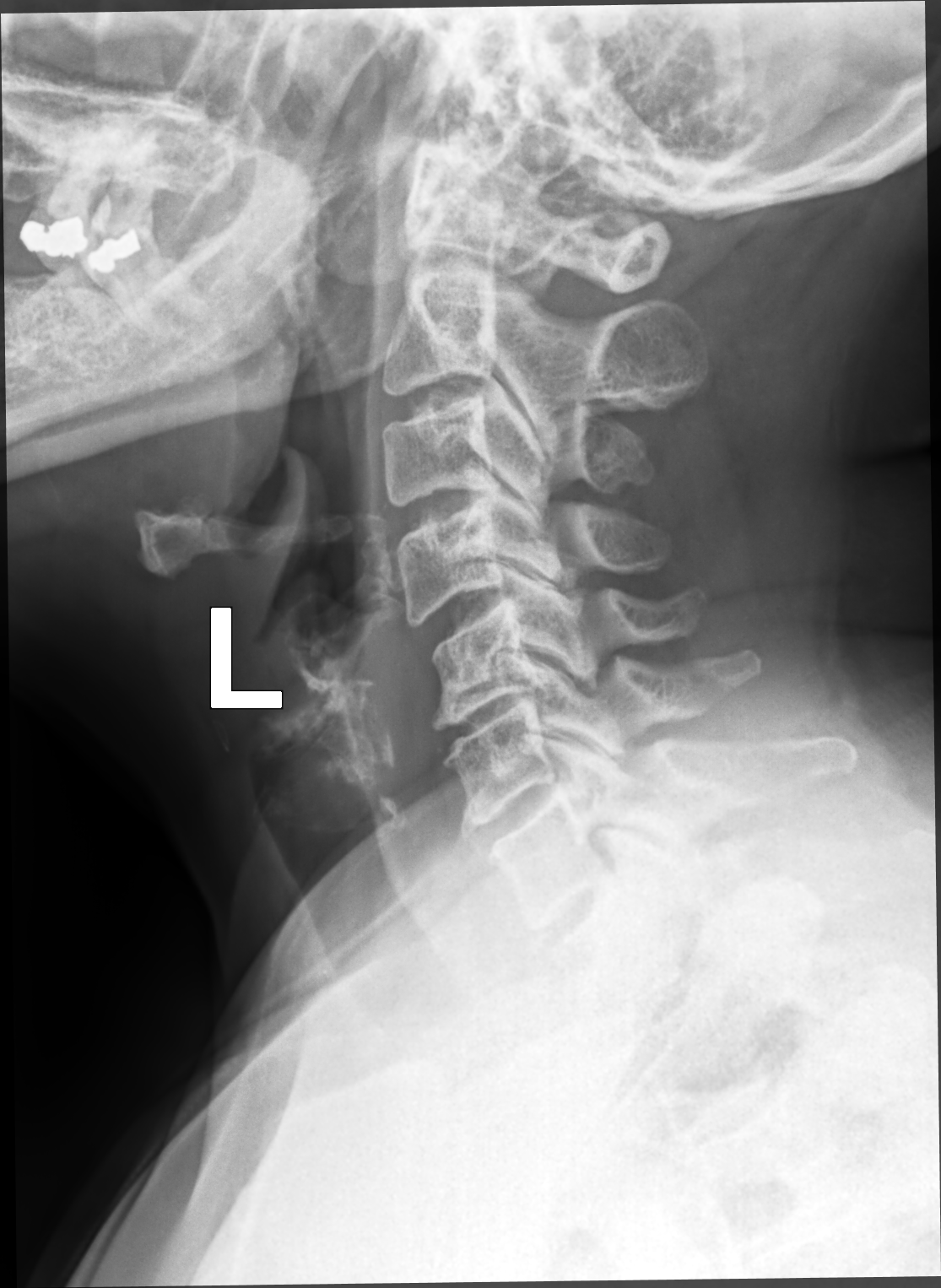

[cervical spine open mouth ap]
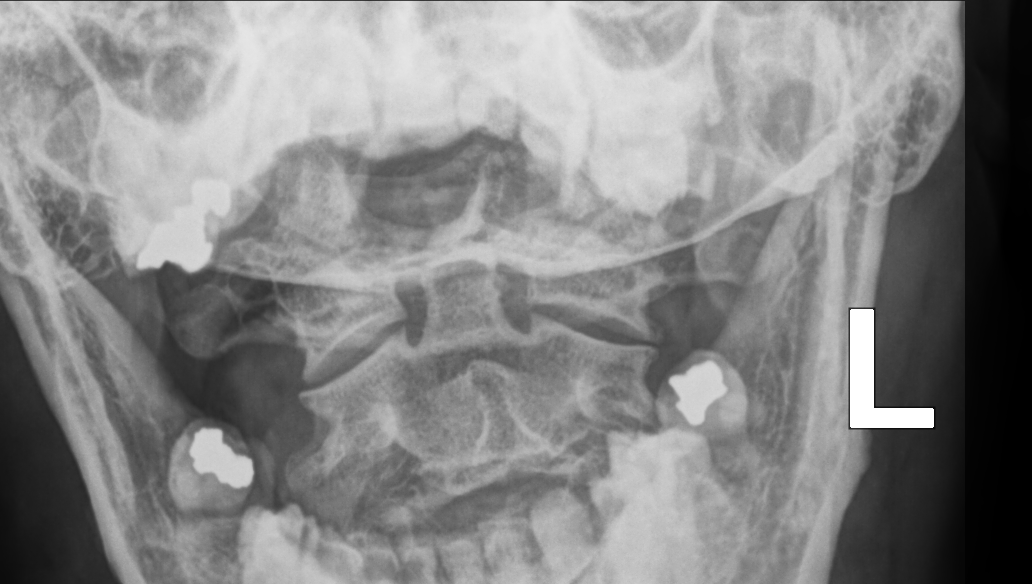

[cervical spine ap]
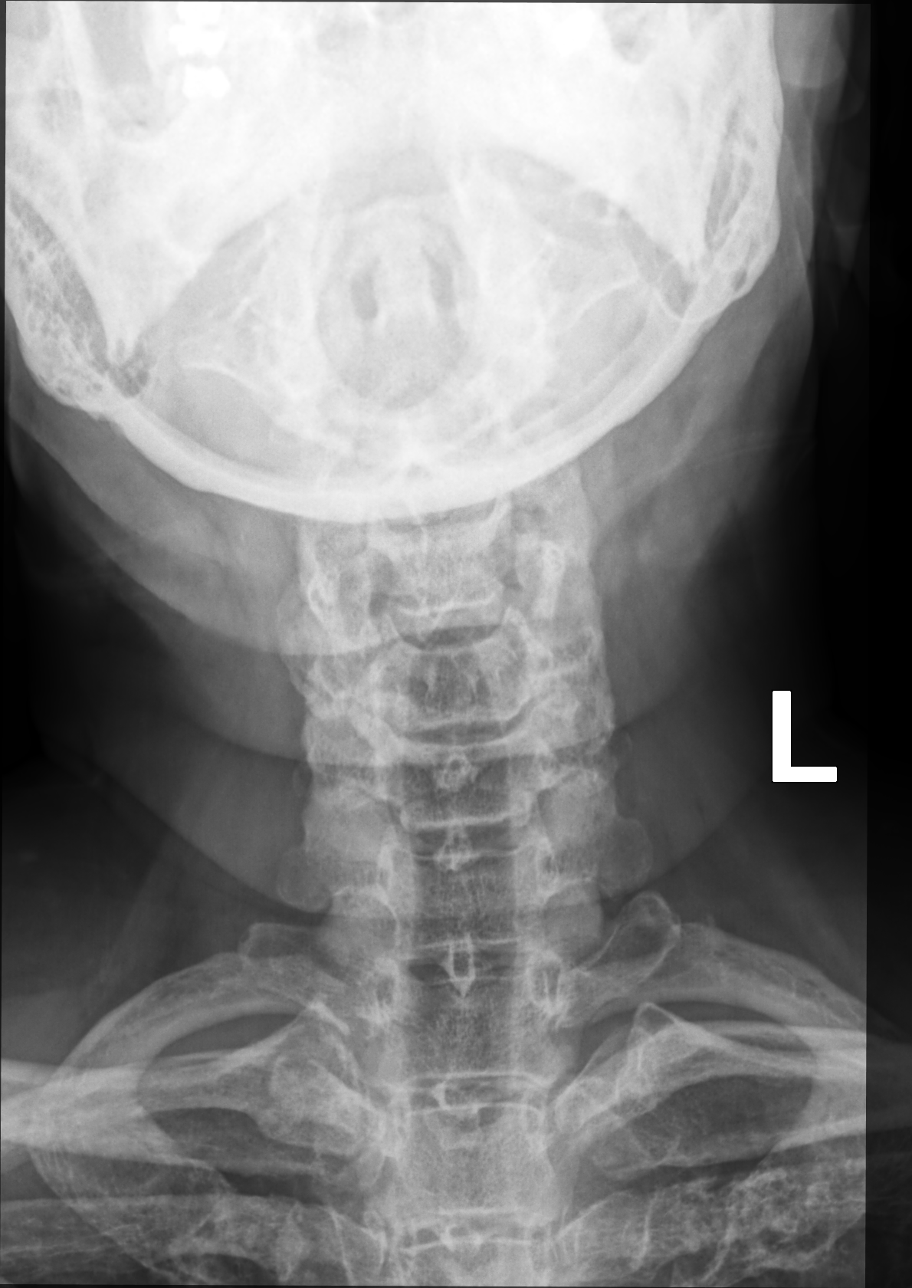

[5 of 5 positions shown; findings below may reference images not displayed]

FINDINGS: Similar straightening of normal lordosis. Trace anterolisthesis of
C4 on C5 and retrolisthesis of C5 on C6. Mild C5-C6 disc space
narrowing and endplate spurring. Endplate spurring at C6-C7 with
preservation of disc space. Vertebral body heights are preserved.
Facet hypertrophy is more prominent on the right at C4-C5. Bilateral
neural foraminal narrowing at C5-C6. No evidence of fracture, focal
bone lesion, or bony destruction. No prevertebral soft tissue edema.
IMPRESSION: 1. Degenerative disc disease and facet arthropathy at C5-C6, with
bilateral neural foraminal narrowing.
2. Right C4-C5 facet hypertrophy.
3. Straightening of normal lordosis, similar to prior CT. Trace
degenerative anterolisthesis of C4 on C5 and retrolisthesis of C5 on
C6.
